# Patient Record
Sex: Female | Born: 1995 | Race: Asian | Hispanic: No | Marital: Married | Smoking: Never smoker
Health system: Southern US, Community
[De-identification: ages and names within clinical notes are randomized; demographics above are authoritative.]

## PROBLEM LIST (undated history)

## (undated) ENCOUNTER — Inpatient Hospital Stay (HOSPITAL_COMMUNITY): Payer: Self-pay

## (undated) DIAGNOSIS — O1490 Unspecified pre-eclampsia, unspecified trimester: Secondary | ICD-10-CM

## (undated) HISTORY — PX: NO PAST SURGERIES: SHX2092

---

## 2013-09-13 ENCOUNTER — Ambulatory Visit: Payer: Self-pay | Admitting: Family Medicine

## 2013-09-20 ENCOUNTER — Encounter: Payer: Self-pay | Admitting: Family Medicine

## 2013-09-20 ENCOUNTER — Ambulatory Visit: Payer: Self-pay | Attending: Family Medicine | Admitting: Family Medicine

## 2013-09-20 VITALS — BP 119/79 | HR 89 | Temp 98.0°F | Resp 16 | Ht 61.0 in | Wt 119.0 lb

## 2013-09-20 DIAGNOSIS — K5900 Constipation, unspecified: Secondary | ICD-10-CM | POA: Insufficient documentation

## 2013-09-20 DIAGNOSIS — R102 Pelvic and perineal pain unspecified side: Secondary | ICD-10-CM | POA: Insufficient documentation

## 2013-09-20 DIAGNOSIS — M545 Low back pain, unspecified: Secondary | ICD-10-CM | POA: Insufficient documentation

## 2013-09-20 DIAGNOSIS — Z0000 Encounter for general adult medical examination without abnormal findings: Secondary | ICD-10-CM

## 2013-09-20 DIAGNOSIS — N949 Unspecified condition associated with female genital organs and menstrual cycle: Secondary | ICD-10-CM | POA: Insufficient documentation

## 2013-09-20 DIAGNOSIS — K5901 Slow transit constipation: Secondary | ICD-10-CM

## 2013-09-20 LAB — POCT URINALYSIS DIPSTICK
BILIRUBIN UA: NEGATIVE
Glucose, UA: NEGATIVE
Ketones, UA: NEGATIVE
LEUKOCYTES UA: NEGATIVE
Nitrite, UA: NEGATIVE
Protein, UA: NEGATIVE
Spec Grav, UA: 1.025
UROBILINOGEN UA: 0.2
pH, UA: 5.5

## 2013-09-20 LAB — POCT URINE PREGNANCY: PREG TEST UR: NEGATIVE

## 2013-09-20 MED ORDER — POLYETHYLENE GLYCOL 3350 17 GM/SCOOP PO POWD: 17.0000 g | Freq: Every day | ORAL | Status: DC

## 2013-09-20 NOTE — Progress Notes (Signed)
   Subjective:    Patient ID: Krystal Wood, female    DOB: Jul 31, 1995, 18 y.o.   MRN: [id number] CC: establish care, pelvic pain  HPI 18 year old female with a past medical history presents to establish care discussed the following:  #1 pelvic pain: Patient reports pelvic pain for the past 4 months since she got married he essentially active. She presented pain is bilateral and constant. Pain is worse with intercourse. Pain associated with midline low back pain. Is also associated dysuria. There is no associated vaginal discharge. There is no vaginal odor. There is no itching or irritation. Patient and her husband do not use condoms. Patient reports that she does not enjoy sex because of the discomfort. Patient with her husband very nice manner for cystoscopy had sex she is not willing.   Patient does endorse constipation. She has a bowel movement once weekly. She did have out of is not painful.  #2 low back pain: Patient is a history of low back pain usually with menses since menarche age 19. She now has chronic low back pain since being married. Pain does not radiate. She's had no fever chills. There is no fecal or urinary incontinence. There's been no injury.  Social history: Patient moved from [place] 3 years ago. She lives with her husband's family.   Review of Systems As per HPI     Objective:   Physical Exam BP 119/79  Pulse 89  Temp(Src) 98 F (36.7 C) (Oral)  Resp 16  Ht  (1.549 m)  Wt 119 lb (53.978 kg)  BMI 22.50 kg/m2  SpO2 99%  LMP 08/23/2013 General appearance: alert, cooperative and no distress Head: Normocephalic, without obvious abnormality, atraumatic Eyes: conjunctivae/corneas clear. PERRL, EOM's intact. Fundi benign. Ears: normal TM's and external ear canals both ears Nose: Nares normal. Septum midline. Mucosa normal. No drainage or sinus tenderness. Throat: lips, mucosa, and tongue normal; teeth and gums normal Neck: no adenopathy, supple, symmetrical,  trachea midline and thyroid not enlarged, symmetric, no tenderness/mass/nodules Lungs: clear to auscultation bilaterally Heart: regular rate and rhythm, S1, S2 normal, no murmur, click, rub or gallop Abdomen: soft, non-tender; bowel sounds normal; no masses,  no organomegaly Back: midline lumbar pain around L1/L2, non tender  Pelvic: normal external genitalia, normal vagina, Scant mucoid vaginal discharge, cervix is normal without lesions, no CMT, uterus is normal without mass. Slight uterine and adnexal tenderness. Extremities: extremities normal, atraumatic, no cyanosis or edema Pulses: 2+ and symmetric Skin: Skin color, texture, turgor normal. No rashes or lesions      Assessment & Plan:

## 2013-09-20 NOTE — Progress Notes (Signed)
Pt is here to establish care. Pt reports having abdomen pain and lower back for 3 months.

## 2013-09-20 NOTE — Patient Instructions (Signed)
Krystal Wood,  Thank you for coming in today. I look forward to being your primary doctor.  I will be in touch with lab results.  Exam normal. For pain: Start miralax to regulate bowel movements. Goal is one soft stool every 1-2 days. Drink plenty of water and eat fiber.  Continue ibuprofen with foods as needed.  Come back if pain does not improve with regulating bowel movements or pain worsens.   Dr. Armen Pickup

## 2013-09-20 NOTE — Assessment & Plan Note (Signed)
A: pelvic pain. Normal exam. History consistent with constipation.  UA neg U preg neg P: Reassurance NSAID prn

## 2013-09-20 NOTE — Assessment & Plan Note (Signed)
A:  Constipation with bowel movements once weekly. I like her abdominal pain is related to this. No signs or symptoms of  Acute inflammatory process. P: Treat with MiraLax.  Advised increased intake of water and fiber.  Followup as needed.

## 2013-09-21 LAB — CYTOLOGY - PAP

## 2013-10-25 ENCOUNTER — Telehealth: Payer: Self-pay | Admitting: *Deleted

## 2013-10-25 NOTE — Telephone Encounter (Signed)
Pt is aware of her results.  

## 2013-12-17 ENCOUNTER — Encounter: Payer: Self-pay | Admitting: Family Medicine

## 2013-12-17 ENCOUNTER — Ambulatory Visit: Payer: Self-pay | Attending: Family Medicine | Admitting: Family Medicine

## 2013-12-17 VITALS — BP 107/75 | HR 71 | Temp 98.2°F | Resp 16 | Ht 61.0 in | Wt 122.0 lb

## 2013-12-17 DIAGNOSIS — Z331 Pregnant state, incidental: Secondary | ICD-10-CM

## 2013-12-17 DIAGNOSIS — Z3490 Encounter for supervision of normal pregnancy, unspecified, unspecified trimester: Secondary | ICD-10-CM | POA: Insufficient documentation

## 2013-12-17 DIAGNOSIS — R102 Pelvic and perineal pain: Secondary | ICD-10-CM

## 2013-12-17 DIAGNOSIS — Z3201 Encounter for pregnancy test, result positive: Secondary | ICD-10-CM | POA: Insufficient documentation

## 2013-12-17 LAB — POCT URINE PREGNANCY: PREG TEST UR: POSITIVE

## 2013-12-17 MED ORDER — PRENATAL 27-0.8 MG PO TABS: 1.0000 | ORAL_TABLET | Freq: Every day | ORAL | Status: DC

## 2013-12-17 NOTE — Assessment & Plan Note (Addendum)
A: early pregnancy, 3 w 1 d by LMP P: Prenatal vitamin Information provided Reviewed diet, medications to avoid, what to do in case of vaginal bleeding or severe pain  Pregnancy verification letter provided # to adopt-a-mom provided Patient instructed to find an OB and scheduled first prenatal visit by week 12 of pregnancy

## 2013-12-17 NOTE — Progress Notes (Signed)
Pt comes in to f/u for repeat pregnancy test s/p late menstrual LMP- 102/9/15 C/o nausea x 2 weeks with 2 episodes of vomiting x2 dys Breast tenderness also Preg test obtained

## 2013-12-17 NOTE — Progress Notes (Signed)
   Subjective:    Patient ID: Krystal Wood, female    DOB: [birth year], 18 y.o.   MRN: [id number] CC: ? Pregnancy  HPI 18 yo F presents for pregnancy test. LMP 11/25/13. Sexually active with husband. Trying to conceive. She has nausea for past week. Emesis x one yesterday. Fatigue. No fever, diarrhea, sick contacts. She had mild lower abdominal discomfort. No vaginal bleeding or discharge. No previous pregnancies.   Soc hx: non smoker, non ETOH, non drug use  Review of Systems As per HPI    Objective:   Physical Exam BP 107/75 mmHg  Pulse 71  Temp(Src) 98.2 F (36.8 C) (Oral)  Resp 16  Ht 5\' 1"  (1.549 m)  Wt 122 lb (55.339 kg)  BMI 23.06 kg/m2  SpO2 99%  LMP 11/25/2013 General appearance: alert, cooperative and no distress Abdomen: soft, non-tender; bowel sounds normal; no masses,  no organomegaly Skin: Skin color, texture, turgor normal. No rashes or lesions   U preg: POSITIVE        Assessment & Plan:

## 2013-12-17 NOTE — Patient Instructions (Addendum)
Ms. Krystal Wood,  Congratulations you are pregnant! Based on your last period of 11/25/13, you are 3 weeks and 1 day pregnant with a due date of 09/01/2014.   [facility] for [facility].  If you do not qualify for pregnancy medicaid, please call adopt a mom.  Adopt-A-Mom Program should contact Clance BollValeria Santolim at [phone] for an appointment.   About the Adopt-A-Mom Program: The Adopt-A-Mom Program is sponsored by the [employer] and coordinates prenatal care for low to medium-risk pregnant women who are not eligible for Medicaid, do not have private insurance to cover the cost of care, and cannot afford to pay out of pocket for care. Patients who wish to enroll in the Adopt-A-Mom Program should contact Clance BollValeria Santolim at [phone] for an appointment. The following is a current list of all participating OB[place]N practices in [place] who help ensure access to prenatal care for all women by providing prenatal care to Adopt-A-Mom Program participants: Dr. Francoise [facility]ard [facility] Dr. Arther [facility] The Adopt-A-Mom Program has also negotiated discounted rates for labwork and ultrasounds with our partners at [facility], [facility], and [facility].   You should aim to have your first prenatal visit by week  12 of pregnancy, in 9 more weeks.  Start prenatal vitamin.  If your hemoglobin is low you will need to start iron.  Dr. Armen PickupFunches   First Trimester of Pregnancy The first trimester of pregnancy is from week 1 until the end of week 12 (months 1 through 3). During this time, your baby will begin to develop inside you. At 6-8 weeks, the eyes and face are formed, and the heartbeat can be seen on ultrasound. At the end of 12 weeks, all the baby's organs are formed. Prenatal care is all the  medical care you receive before the birth of your baby. Make sure you get good prenatal care and follow all of your doctor's instructions. HOME CARE  Medicines  Take medicine only as told by your doctor. Some medicines are safe and some are not during pregnancy.  Take your prenatal vitamins as told by your doctor.  Take medicine that helps you poop (stool softener) as needed if your doctor says it is okay. Diet  Eat regular, healthy meals.  Your doctor will tell you the amount of weight gain that is right for you.  Avoid raw meat and uncooked cheese.  If you feel sick to your stomach (nauseous) or throw up (vomit):  Eat 4 or 5 small meals a day instead of 3 large meals.  Try eating a few soda crackers.  Drink liquids between meals instead of during meals.  If you have a hard time pooping (constipation):  Eat high-fiber foods like fresh vegetables, fruit, and whole grains.  Drink enough fluids to keep your pee (urine) clear or pale yellow. Activity and Exercise  Exercise only as told by your doctor. Stop exercising if you have cramps or pain in your lower belly (abdomen) or low back.  Try to avoid standing for long periods of time. Move your legs often if you must stand in one place for a long time.  Avoid heavy lifting.  Wear low-heeled shoes. Sit and stand up straight.  You can have sex unless your doctor tells you not to. Relief of Pain or Discomfort  Wear a good support bra if  your breasts are sore.  Take warm water baths (sitz baths) to soothe pain or discomfort caused by hemorrhoids. Use hemorrhoid cream if your doctor says it is okay.  Rest with your legs raised if you have leg cramps or low back pain.  Wear support hose if you have puffy, bulging veins (varicose veins) in your legs. Raise (elevate) your feet for 15 minutes, 3-4 times a day. Limit salt in your diet. Prenatal Care  Schedule your prenatal visits by the twelfth week of pregnancy.  Write down  your questions. Take them to your prenatal visits.  Keep all your prenatal visits as told by your doctor. Safety  Wear your seat belt at all times when driving.  Make a list of emergency phone numbers. The list should include numbers for family, friends, the hospital, and police and fire departments. General Tips  Ask your doctor for a referral to a local prenatal class. Begin classes no later than at the start of month 6 of your pregnancy.  Ask for help if you need counseling or help with nutrition. Your doctor can give you advice or tell you where to go for help.  Do not use hot tubs, steam rooms, or saunas.  Do not douche or use tampons or scented sanitary pads.  Do not cross your legs for long periods of time.  Avoid litter boxes and soil used by cats.  Avoid all smoking, herbs, and alcohol. Avoid drugs not approved by your doctor.  Visit your dentist. At home, brush your teeth with a soft toothbrush. Be gentle when you floss. GET HELP IF:  You are dizzy.  You have mild cramps or pressure in your lower belly.  You have a nagging pain in your belly area.  You continue to feel sick to your stomach, throw up, or have watery poop (diarrhea).  You have a bad smelling fluid coming from your vagina.  You have pain with peeing (urination).  You have increased puffiness (swelling) in your face, hands, legs, or ankles. GET HELP RIGHT AWAY IF:   You have a fever.  You are leaking fluid from your vagina.  You have spotting or bleeding from your vagina.  You have very bad belly cramping or pain.  You gain or lose weight rapidly.  You throw up blood. It may look like coffee grounds.  You are around people who have [place] measles, fifth disease, or chickenpox.  You have a very bad headache.  You have shortness of breath.  You have any kind of trauma, such as from a fall or a car accident. Document Released: 07/03/2007 Document Revised: 05/31/2013 Document Reviewed:  11/24/2012 [facility] Patient Information 2015 [place]. This information is not intended to replace advice given to you by your health care provider. Make sure you discuss any questions you have with your health care provider.

## 2013-12-18 LAB — CBC
HCT: 33.4 % — ABNORMAL LOW (ref 36.0–46.0)
Hemoglobin: 11.6 g/dL — ABNORMAL LOW (ref 12.0–15.0)
MCH: 30 pg (ref 26.0–34.0)
MCHC: 34.7 g/dL (ref 30.0–36.0)
MCV: 86.3 fL (ref 78.0–100.0)
MPV: 11.6 fL (ref 9.4–12.4)
PLATELETS: 203 10*3/uL (ref 150–400)
RBC: 3.87 MIL/uL (ref 3.87–5.11)
RDW: 13.2 % (ref 11.5–15.5)
WBC: 6 10*3/uL (ref 4.0–10.5)

## 2013-12-28 ENCOUNTER — Telehealth: Payer: Self-pay | Admitting: *Deleted

## 2013-12-28 NOTE — Telephone Encounter (Signed)
Left message with normal lab, if any question return call

## 2013-12-28 NOTE — Telephone Encounter (Signed)
-----   Message from Lora PaulaJosalyn C Funches, MD sent at 12/20/2013 10:37 AM EST ----- Normal CBC No need for extra iron

## 2014-01-03 ENCOUNTER — Other Ambulatory Visit: Payer: Self-pay | Admitting: Family Medicine

## 2014-01-03 DIAGNOSIS — O219 Vomiting of pregnancy, unspecified: Secondary | ICD-10-CM | POA: Insufficient documentation

## 2014-01-03 MED ORDER — VITAMIN B-6 25 MG PO TABS: 25.0000 mg | ORAL_TABLET | Freq: Four times a day (QID) | ORAL | Status: DC | PRN

## 2014-01-03 MED ORDER — DOXYLAMINE SUCCINATE (SLEEP) 25 MG PO TABS: 25.0000 mg | ORAL_TABLET | Freq: Three times a day (TID) | ORAL | Status: DC | PRN

## 2014-01-28 NOTE — L&D Delivery Note (Cosign Needed)
Delivery Note At 8:37 AM a viable female was delivered via Vaginal, Spontaneous Delivery (Presentation: vertex; Occiput Anterior).  APGAR: 9, 10; weight pending .   Placenta status: spontaneous, intact.  Cord: 3 vessels with the following complications: None.    Anesthesia: Epidural  Episiotomy: None Lacerations: 2nd degree;Perineal Suture Repair: vicryl Est. Blood Loss (mL):  pending  Mom to postpartum.  Baby to Couplet care / Skin to Skin.  Erasmo DownerAngela M Bacigalupo, MD, MPH PGY-2,  [facility] 07/30/2014 8:50 AM

## 2014-02-25 ENCOUNTER — Other Ambulatory Visit: Payer: Self-pay

## 2014-02-25 ENCOUNTER — Encounter: Payer: Self-pay | Admitting: Family Medicine

## 2014-03-04 ENCOUNTER — Other Ambulatory Visit (INDEPENDENT_AMBULATORY_CARE_PROVIDER_SITE_OTHER): Payer: Self-pay

## 2014-03-04 ENCOUNTER — Encounter: Payer: Self-pay | Admitting: Family Medicine

## 2014-03-04 ENCOUNTER — Other Ambulatory Visit: Payer: Self-pay

## 2014-03-04 DIAGNOSIS — Z3401 Encounter for supervision of normal first pregnancy, first trimester: Secondary | ICD-10-CM

## 2014-03-04 LAB — HIV ANTIBODY (ROUTINE TESTING W REFLEX): HIV 1&2 Ab, 4th Generation: NONREACTIVE

## 2014-03-04 NOTE — Progress Notes (Signed)
NEW OB LABS DONE TODAY Krystal Wood 

## 2014-03-05 LAB — SICKLE CELL SCREEN: Sickle Cell Screen: NEGATIVE

## 2014-03-07 LAB — OBSTETRIC PANEL
Antibody Screen: NEGATIVE
BASOS PCT: 0 % (ref 0–1)
Basophils Absolute: 0 10*3/uL (ref 0.0–0.1)
EOS PCT: 1 % (ref 0–5)
Eosinophils Absolute: 0.1 10*3/uL (ref 0.0–0.7)
HCT: 32.7 % — ABNORMAL LOW (ref 36.0–46.0)
Hemoglobin: 11.3 g/dL — ABNORMAL LOW (ref 12.0–15.0)
Hepatitis B Surface Ag: NEGATIVE
Lymphocytes Relative: 19 % (ref 12–46)
Lymphs Abs: 1.6 10*3/uL (ref 0.7–4.0)
MCH: 31 pg (ref 26.0–34.0)
MCHC: 34.6 g/dL (ref 30.0–36.0)
MCV: 89.8 fL (ref 78.0–100.0)
MONO ABS: 0.5 10*3/uL (ref 0.1–1.0)
MPV: 11.2 fL (ref 8.6–12.4)
Monocytes Relative: 6 % (ref 3–12)
Neutro Abs: 6.4 10*3/uL (ref 1.7–7.7)
Neutrophils Relative %: 74 % (ref 43–77)
PLATELETS: 214 10*3/uL (ref 150–400)
RBC: 3.64 MIL/uL — AB (ref 3.87–5.11)
RDW: 13.9 % (ref 11.5–15.5)
Rh Type: POSITIVE
Rubella: 4.07 Index — ABNORMAL HIGH (ref <0.90)
WBC: 8.6 10*3/uL (ref 4.0–10.5)

## 2014-03-22 ENCOUNTER — Ambulatory Visit (INDEPENDENT_AMBULATORY_CARE_PROVIDER_SITE_OTHER): Payer: Self-pay | Admitting: Family Medicine

## 2014-03-22 ENCOUNTER — Encounter: Payer: Self-pay | Admitting: Family Medicine

## 2014-03-22 VITALS — BP 107/71 | HR 91 | Temp 97.5°F | Wt 122.8 lb

## 2014-03-22 DIAGNOSIS — Z331 Pregnant state, incidental: Secondary | ICD-10-CM

## 2014-03-22 DIAGNOSIS — Z3490 Encounter for supervision of normal pregnancy, unspecified, unspecified trimester: Secondary | ICD-10-CM

## 2014-03-22 MED ORDER — PRENATAL 27-0.8 MG PO TABS
1.0000 | ORAL_TABLET | Freq: Every day | ORAL | Status: DC
Start: 2014-03-22 — End: 2014-07-29

## 2014-03-22 NOTE — Progress Notes (Signed)
Krystal CarawayZainab Wood is a 19 y.o. yo G1P0 at 3449w4d by LMP who presents for her initial prenatal visit. Pregnancy  isplanned [facility]. She  is notTaking PNV. See flow sheet for details.  PMH, POBH, FH, meds, allergies and Social Hx reviewed.  Prenatal exam:Gen: Well nourished, well developed.  No distress.  Vitals noted. HEENT: Normocephalic, atraumatic.  Neck supple without cervical lymphadenopathy, thyromegaly or thyroid nodules.  fair dentition. CV: RRR no murmur, gallops or rubs Lungs: CTAB.  Normal respiratory effort without wheezes or rales. Abd: soft, NTND. +BS.  Uterus not appreciated above pelvis. GU: Deferred. Recent Pap smear and vaginal exam Ext: No clubbing, cyanosis or edema. Psych: Normal grooming and dress.  Not depressed or anxious appearing.  Normal thought content and process without flight of ideas or looseness of associations    Assessment/plan: 1) Pregnancy doing well.  Current pregnancy issues include not taking PNV Dating is reliable, but measuring a little larger than dates Prenatal labs reviewed, notable for wnl. Bleeding and pain precautions reviewed. Importance of prenatal vitamins reviewed.  Genetic screening offered.  Early glucola is not indicated.    Follow up 4 weeks.

## 2014-03-22 NOTE — Progress Notes (Deleted)
  Subjective:    Krystal CarawayZainab Schwager is being seen today for her first obstetrical visit.  This {is/is not:9024} a planned pregnancy. She is at Unknown gestation. Her obstetrical history is significant for {ob risk factors:10154}. Relationship with FOB: {fob:16621}. Patient {does/does not:19097} intend to breast feed. Pregnancy history fully reviewed.  Patient reports {sx:14538}.  Review of Systems:   Review of Systems  Objective:     BP 107/71 mmHg  Pulse 91  Temp(Src) 97.5 F (36.4 C)  Wt 122 lb 12.8 oz (55.702 kg)  LMP 11/25/2013 Physical Exam  Exam    Assessment:    Pregnancy: G1P0 Patient Active Problem List   Diagnosis Date Noted  . Nausea and vomiting in pregnancy prior to [redacted] weeks gestation 01/03/2014  . Pregnant 12/17/2013  . Pelvic pain in female 09/20/2013  . Constipation 09/20/2013       Plan:     Initial labs drawn. Prenatal vitamins. Problem list reviewed and updated. AFP3 discussed: {requests/ordered/declines:14581}. Role of ultrasound in pregnancy discussed; fetal survey: {requests/ordered/declines:14581}. Amniocentesis discussed: {amniocentesis:14582}. Follow up in {numbers 0-4:31231} weeks. ***% of *** min visit spent on counseling and coordination of care.  ***   Shirlee LatchBacigalupo, Tinia Oravec 03/22/2014

## 2014-03-22 NOTE — Patient Instructions (Signed)
Second Trimester of Pregnancy The second trimester is from week 13 through week 28, months 4 through 6. The second trimester is often a time when you feel your best. Your body has also adjusted to being pregnant, and you begin to feel better physically. Usually, morning sickness has lessened or quit completely, you may have more energy, and you may have an increase in appetite. The second trimester is also a time when the fetus is growing rapidly. At the end of the sixth month, the fetus is about 9 inches long and weighs about 1 pounds. You will likely begin to feel the baby move (quickening) between 18 and 20 weeks of the pregnancy. BODY CHANGES Your body goes through many changes during pregnancy. The changes vary from woman to woman.   Your weight will continue to increase. You will notice your lower abdomen bulging out.  You may begin to get stretch marks on your hips, abdomen, and breasts.  You may develop headaches that can be relieved by medicines approved by your health care provider.  You may urinate more often because the fetus is pressing on your bladder.  You may develop or continue to have heartburn as a result of your pregnancy.  You may develop constipation because certain hormones are causing the muscles that push waste through your intestines to slow down.  You may develop hemorrhoids or swollen, bulging veins (varicose veins).  You may have back pain because of the weight gain and pregnancy hormones relaxing your joints between the bones in your pelvis and as a result of a shift in weight and the muscles that support your balance.  Your breasts will continue to grow and be tender.  Your gums may bleed and may be sensitive to brushing and flossing.  Dark spots or blotches (chloasma, mask of pregnancy) may develop on your face. This will likely fade after the baby is born.  A dark line from your belly button to the pubic area (linea nigra) may appear. This will likely fade  after the baby is born.  You may have changes in your hair. These can include thickening of your hair, rapid growth, and changes in texture. Some women also have hair loss during or after pregnancy, or hair that feels dry or thin. Your hair will most likely return to normal after your baby is born. WHAT TO EXPECT AT YOUR PRENATAL VISITS During a routine prenatal visit:  You will be weighed to make sure you and the fetus are growing normally.  Your blood pressure will be taken.  Your abdomen will be measured to track your baby's growth.  The fetal heartbeat will be listened to.  Any test results from the previous visit will be discussed. Your health care provider may ask you:  How you are feeling.  If you are feeling the baby move.  If you have had any abnormal symptoms, such as leaking fluid, bleeding, severe headaches, or abdominal cramping.  If you have any questions. Other tests that may be performed during your second trimester include:  Blood tests that check for:  Low iron levels (anemia).  Gestational diabetes (between 24 and 28 weeks).  Rh antibodies.  Urine tests to check for infections, diabetes, or protein in the urine.  An ultrasound to confirm the proper growth and development of the baby.  An amniocentesis to check for possible genetic problems.  Fetal screens for spina bifida and Down syndrome. HOME CARE INSTRUCTIONS   Avoid all smoking, herbs, alcohol, and unprescribed   drugs. These chemicals affect the formation and growth of the baby.  Follow your health care provider's instructions regarding medicine use. There are medicines that are either safe or unsafe to take during pregnancy.  Exercise only as directed by your health care provider. Experiencing uterine cramps is a good sign to stop exercising.  Continue to eat regular, healthy meals.  Wear a good support bra for breast tenderness.  Do not use hot tubs, steam rooms, or saunas.  Wear your  seat belt at all times when driving.  Avoid raw meat, uncooked cheese, cat litter boxes, and soil used by cats. These carry germs that can cause birth defects in the baby.  Take your prenatal vitamins.  Try taking a stool softener (if your health care provider approves) if you develop constipation. Eat more high-fiber foods, such as fresh vegetables or fruit and whole grains. Drink plenty of fluids to keep your urine clear or pale yellow.  Take warm sitz baths to soothe any pain or discomfort caused by hemorrhoids. Use hemorrhoid cream if your health care provider approves.  If you develop varicose veins, wear support hose. Elevate your feet for 15 minutes, 3-4 times a day. Limit salt in your diet.  Avoid heavy lifting, wear low heel shoes, and practice good posture.  Rest with your legs elevated if you have leg cramps or low back pain.  Visit your dentist if you have not gone yet during your pregnancy. Use a soft toothbrush to brush your teeth and be gentle when you floss.  A sexual relationship may be continued unless your health care provider directs you otherwise.  Continue to go to all your prenatal visits as directed by your health care provider. SEEK MEDICAL CARE IF:   You have dizziness.  You have mild pelvic cramps, pelvic pressure, or nagging pain in the abdominal area.  You have persistent nausea, vomiting, or diarrhea.  You have a bad smelling vaginal discharge.  You have pain with urination. SEEK IMMEDIATE MEDICAL CARE IF:   You have a fever.  You are leaking fluid from your vagina.  You have spotting or bleeding from your vagina.  You have severe abdominal cramping or pain.  You have rapid weight gain or loss.  You have shortness of breath with chest pain.  You notice sudden or extreme swelling of your face, hands, ankles, feet, or legs.  You have not felt your baby move in over an hour.  You have severe headaches that do not go away with  medicine.  You have vision changes. Document Released: 01/08/2001 Document Revised: 01/19/2013 Document Reviewed: 03/17/2012 ExitCare Patient Information 2015 [employer]. This information is not intended to replace advice given to you by your health care provider. Make sure you discuss any questions you have with your health care provider.  

## 2014-04-11 ENCOUNTER — Ambulatory Visit (HOSPITAL_COMMUNITY)
Admission: RE | Admit: 2014-04-11 | Discharge: 2014-04-11 | Disposition: A | Discharge status: Home / Self Care | Payer: Self-pay | Source: Ambulatory Visit | Attending: Family Medicine | Admitting: Family Medicine

## 2014-04-11 DIAGNOSIS — Z3689 Encounter for other specified antenatal screening: Secondary | ICD-10-CM | POA: Insufficient documentation

## 2014-04-11 DIAGNOSIS — Z3490 Encounter for supervision of normal pregnancy, unspecified, unspecified trimester: Secondary | ICD-10-CM

## 2014-04-11 DIAGNOSIS — Z3A24 24 weeks gestation of pregnancy: Secondary | ICD-10-CM | POA: Insufficient documentation

## 2014-04-11 DIAGNOSIS — Z36 Encounter for antenatal screening of mother: Secondary | ICD-10-CM | POA: Insufficient documentation

## 2014-04-14 ENCOUNTER — Encounter: Payer: Self-pay | Admitting: Family Medicine

## 2014-04-14 ENCOUNTER — Ambulatory Visit (INDEPENDENT_AMBULATORY_CARE_PROVIDER_SITE_OTHER): Payer: Self-pay | Admitting: Family Medicine

## 2014-04-14 ENCOUNTER — Other Ambulatory Visit (HOSPITAL_COMMUNITY)
Admission: RE | Admit: 2014-04-14 | Discharge: 2014-04-14 | Disposition: A | Discharge status: Home / Self Care | Payer: Self-pay | Source: Ambulatory Visit | Attending: Family Medicine | Admitting: Family Medicine

## 2014-04-14 VITALS — BP 117/67 | HR 101 | Temp 97.7°F | Wt 128.0 lb

## 2014-04-14 DIAGNOSIS — R42 Dizziness and giddiness: Secondary | ICD-10-CM

## 2014-04-14 DIAGNOSIS — Z3492 Encounter for supervision of normal pregnancy, unspecified, second trimester: Secondary | ICD-10-CM

## 2014-04-14 DIAGNOSIS — Z01419 Encounter for gynecological examination (general) (routine) without abnormal findings: Secondary | ICD-10-CM | POA: Insufficient documentation

## 2014-04-14 LAB — POCT HEMOGLOBIN: Hemoglobin: 10.3 g/dL — AB (ref 12.2–16.2)

## 2014-04-14 NOTE — Patient Instructions (Signed)
It was nice seeing you today. Everything to be going on well with your pregnancy. Please try to eat 3 times daily with snack in between. This will help with your dizziness. We will check your blood level today to ensure all is well. If dizziness becomes persistent [facility]. We will see you back in 4 wks.

## 2014-04-14 NOTE — Progress Notes (Signed)
19 Y/O G1P0 present for routine prenatal care, denies abdominal pain, no vaginal discharge or bleeding, no leg swelling. She c/o occasional dizziness, she denies fainting or LOC. She does not eat well, less than 3 meal per day. She uses her prenatal vitamin.   Exam: Check flow sheet. CV/Resp: Lungs CTA B/L. S1 S2 normal, RRR,no murmurs. Abd: Gravid, no tenderness. Ext: No edema  A/P: 19 Y/O G1P0 at 20 wk by LMP which she is uncertain or [redacted] wks GA by U/S done 3 days ago.        Since she is uncertain of her LMP we will go by the U/S.        Continue prenatal vitamin.        Hgb dropped to 10 today from 11.         May add iron to PNV.        I encourage good diet with 3 meals per day and snack in between for health baby and mother.        Advised to go to [facility] if dizziness persist or worsens.        Flu shot offered but she declined it.        PAP done today.        F/U in 4 wks.

## 2014-04-18 LAB — CYTOLOGY - PAP

## 2014-04-19 ENCOUNTER — Telehealth: Payer: Self-pay | Admitting: Family Medicine

## 2014-04-19 NOTE — Telephone Encounter (Signed)
PAP result discussed with patient,all questions were answered.

## 2014-05-19 ENCOUNTER — Ambulatory Visit (INDEPENDENT_AMBULATORY_CARE_PROVIDER_SITE_OTHER): Payer: Self-pay | Admitting: Family Medicine

## 2014-05-19 ENCOUNTER — Other Ambulatory Visit (HOSPITAL_COMMUNITY): Admission: RE | Admit: 2014-05-19 | Payer: Self-pay | Source: Ambulatory Visit

## 2014-05-19 VITALS — BP 115/77 | HR 109 | Temp 98.2°F | Wt 139.8 lb

## 2014-05-19 DIAGNOSIS — Z3403 Encounter for supervision of normal first pregnancy, third trimester: Secondary | ICD-10-CM

## 2014-05-19 DIAGNOSIS — Z23 Encounter for immunization: Secondary | ICD-10-CM

## 2014-05-19 LAB — POCT WET PREP (WET MOUNT): CLUE CELLS WET PREP WHIFF POC: NEGATIVE

## 2014-05-19 LAB — CBC
HCT: 32.9 % — ABNORMAL LOW (ref 36.0–46.0)
Hemoglobin: 11.1 g/dL — ABNORMAL LOW (ref 12.0–15.0)
MCH: 30.9 pg (ref 26.0–34.0)
MCHC: 33.7 g/dL (ref 30.0–36.0)
MCV: 91.6 fL (ref 78.0–100.0)
MPV: 11.3 fL (ref 8.6–12.4)
Platelets: 186 10*3/uL (ref 150–400)
RBC: 3.59 MIL/uL — ABNORMAL LOW (ref 3.87–5.11)
RDW: 13.1 % (ref 11.5–15.5)
WBC: 10.6 10*3/uL — AB (ref 4.0–10.5)

## 2014-05-19 LAB — GLUCOSE, CAPILLARY
Comment 1: 1
GLUCOSE-CAPILLARY: 124 mg/dL — AB (ref 70–99)

## 2014-05-19 NOTE — Patient Instructions (Signed)
If you have any cramping/contractions, vaginal bleeding, fluid leaking, or are worried that baby is not moving normally, go immediately to [facility] to be evaluated.  Follow up in 2 weeks  Be well, Dr. Pollie Meyer    Third Trimester of Pregnancy The third trimester is from week 29 through week 42, months 7 through 9. The third trimester is a time when the fetus is growing rapidly. At the end of the ninth month, the fetus is about 20 inches in length and weighs 6-10 pounds.  BODY CHANGES Your body goes through many changes during pregnancy. The changes vary from woman to woman.   Your weight will continue to increase. You can expect to gain 25-35 pounds (11-16 kg) by the end of the pregnancy.  You may begin to get stretch marks on your hips, abdomen, and breasts.  You may urinate more often because the fetus is moving lower into your pelvis and pressing on your bladder.  You may develop or continue to have heartburn as a result of your pregnancy.  You may develop constipation because certain hormones are causing the muscles that push waste through your intestines to slow down.  You may develop hemorrhoids or swollen, bulging veins (varicose veins).  You may have pelvic pain because of the weight gain and pregnancy hormones relaxing your joints between the bones in your pelvis. Backaches may result from overexertion of the muscles supporting your posture.  You may have changes in your hair. These can include thickening of your hair, rapid growth, and changes in texture. Some women also have hair loss during or after pregnancy, or hair that feels dry or thin. Your hair will most likely return to normal after your baby is born.  Your breasts will continue to grow and be tender. A yellow discharge may leak from your breasts called colostrum.  Your belly button may stick out.  You may feel short of breath because of your expanding uterus.  You may notice the fetus "dropping," or  moving lower in your abdomen.  You may have a bloody mucus discharge. This usually occurs a few days to a week before labor begins.  Your cervix becomes thin and soft (effaced) near your due date. WHAT TO EXPECT AT YOUR PRENATAL EXAMS  You will have prenatal exams every 2 weeks until week 36. Then, you will have weekly prenatal exams. During a routine prenatal visit:  You will be weighed to make sure you and the fetus are growing normally.  Your blood pressure is taken.  Your abdomen will be measured to track your baby's growth.  The fetal heartbeat will be listened to.  Any test results from the previous visit will be discussed.  You may have a cervical check near your due date to see if you have effaced. At around 36 weeks, your caregiver will check your cervix. At the same time, your caregiver will also perform a test on the secretions of the vaginal tissue. This test is to determine if a type of bacteria, Group B streptococcus, is present. Your caregiver will explain this further. Your caregiver may ask you:  What your birth plan is.  How you are feeling.  If you are feeling the baby move.  If you have had any abnormal symptoms, such as leaking fluid, bleeding, severe headaches, or abdominal cramping.  If you have any questions. Other tests or screenings that may be performed during your third trimester include:  Blood tests that check for low iron levels (anemia).  Fetal testing to check the health, activity level, and growth of the fetus. Testing is done if you have certain medical conditions or if there are problems during the pregnancy. FALSE LABOR You may feel small, irregular contractions that eventually go away. These are called Braxton Hicks contractions, or false labor. Contractions may last for hours, days, or even weeks before true labor sets in. If contractions come at regular intervals, intensify, or become painful, it is best to be seen by your caregiver.   SIGNS OF LABOR   Menstrual-like cramps.  Contractions that are 5 minutes apart or less.  Contractions that start on the top of the uterus and spread down to the lower abdomen and back.  A sense of increased pelvic pressure or back pain.  A watery or bloody mucus discharge that comes from the vagina. If you have any of these signs before the 37th week of pregnancy, call your caregiver right away. You need to go to the hospital to get checked immediately. HOME CARE INSTRUCTIONS   Avoid all smoking, herbs, alcohol, and unprescribed drugs. These chemicals affect the formation and growth of the baby.  Follow your caregiver's instructions regarding medicine use. There are medicines that are either safe or unsafe to take during pregnancy.  Exercise only as directed by your caregiver. Experiencing uterine cramps is a good sign to stop exercising.  Continue to eat regular, healthy meals.  Wear a good support bra for breast tenderness.  Do not use hot tubs, steam rooms, or saunas.  Wear your seat belt at all times when driving.  Avoid raw meat, uncooked cheese, cat litter boxes, and soil used by cats. These carry germs that can cause birth defects in the baby.  Take your prenatal vitamins.  Try taking a stool softener (if your caregiver approves) if you develop constipation. Eat more high-fiber foods, such as fresh vegetables or fruit and whole grains. Drink plenty of fluids to keep your urine clear or pale yellow.  Take warm sitz baths to soothe any pain or discomfort caused by hemorrhoids. Use hemorrhoid cream if your caregiver approves.  If you develop varicose veins, wear support hose. Elevate your feet for 15 minutes, 3-4 times a day. Limit salt in your diet.  Avoid heavy lifting, wear low heal shoes, and practice good posture.  Rest a lot with your legs elevated if you have leg cramps or low back pain.  Visit your dentist if you have not gone during your pregnancy. Use a soft  toothbrush to brush your teeth and be gentle when you floss.  A sexual relationship may be continued unless your caregiver directs you otherwise.  Do not travel far distances unless it is absolutely necessary and only with the approval of your caregiver.  Take prenatal classes to understand, practice, and ask questions about the labor and delivery.  Make a trial run to the hospital.  Pack your hospital bag.  Prepare the baby's nursery.  Continue to go to all your prenatal visits as directed by your caregiver. SEEK MEDICAL CARE IF:  You are unsure if you are in labor or if your water has broken.  You have dizziness.  You have mild pelvic cramps, pelvic pressure, or nagging pain in your abdominal area.  You have persistent nausea, vomiting, or diarrhea.  You have a bad smelling vaginal discharge.  You have pain with urination. SEEK IMMEDIATE MEDICAL CARE IF:   You have a fever.  You are leaking fluid from your vagina.  You  have spotting or bleeding from your vagina.  You have severe abdominal cramping or pain.  You have rapid weight loss or gain.  You have shortness of breath with chest pain.  You notice sudden or extreme swelling of your face, hands, ankles, feet, or legs.  You have not felt your baby move in over an hour.  You have severe headaches that do not go away with medicine.  You have vision changes. Document Released: 01/08/2001 Document Revised: 01/19/2013 Document Reviewed: 03/17/2012 [facility] Patient Information 2015 [place]Care, [place]. This information is not intended to replace advice given to you by your health care provider. Make sure you discuss any questions you have with your health care provider.

## 2014-05-19 NOTE — Progress Notes (Signed)
Krystal CarawayZainab Wood is a 19 y.o. G1P0 at 5735w0d for routine follow up.  She reports she's doing well. Hass occasional sharp pain in abdomen that resolves with sitting. No cramping/ctx. No LOF but has lots of white thick discharge. No vaginal bleeding. Good fetal movement.  See flow sheet for details.  A/P: Pregnancy at 3935w0d.  Doing well.   Tdap given today 1 hour GTT done today - normal Also draw CBC, RPR, HIV today. Wet prep & gc chlamydia done as these have not been done prior to now during this pregnancy. Wet prep neg - suggests leukorrhea of pregnancy. PHQ-9 score 4, 0 on #9 (SI), not difficult Pregnancy medical home form reviewed, no concerns Discuss feeding and contraception at next visit. RH status was reviewed and pt does not need Rhogam.  Rhogam was not given today.  Childbirth and education classes were offered but pt declined Preterm labor and fetal movement precautions reviewed. Follow up 2 weeks.

## 2014-05-20 ENCOUNTER — Encounter: Payer: Self-pay | Admitting: Family Medicine

## 2014-05-20 LAB — CERVICOVAGINAL ANCILLARY ONLY
Chlamydia: NEGATIVE
NEISSERIA GONORRHEA: NEGATIVE
Trichomonas: NEGATIVE

## 2014-05-20 LAB — HIV ANTIBODY (ROUTINE TESTING W REFLEX): HIV 1&2 Ab, 4th Generation: NONREACTIVE

## 2014-05-20 LAB — RPR

## 2014-06-02 ENCOUNTER — Ambulatory Visit (INDEPENDENT_AMBULATORY_CARE_PROVIDER_SITE_OTHER): Payer: Self-pay | Admitting: Family Medicine

## 2014-06-02 VITALS — BP 115/75 | HR 105 | Temp 97.7°F | Wt 147.0 lb

## 2014-06-02 DIAGNOSIS — Z3480 Encounter for supervision of other normal pregnancy, unspecified trimester: Secondary | ICD-10-CM | POA: Insufficient documentation

## 2014-06-02 DIAGNOSIS — Z3483 Encounter for supervision of other normal pregnancy, third trimester: Secondary | ICD-10-CM

## 2014-06-02 NOTE — Progress Notes (Signed)
Joneen CarawayZainab Oriol is a 19 y.o. G1P0 at 794w0d for routine follow up.  She reports occasional mild contractions. See flow sheet for details.  A/P: Pregnancy at 194w0d.  Doing well.   Pregnancy issues include none  Review feeding and contraception at next visit Infant circumcision desired not applicable  Tdapwas not given today. Received at 30 weeks GBS/GC/CZ testing was not performed today. Plan to do in 2 weeks  Preterm labor precautions reviewed. Safe sleep discussed. Kick counts reviewed. On review of previous ultrasound a follow-up was recommended in 6-8 weeks which is now so it was scheduled today. Follow up 2 weeks.

## 2014-06-02 NOTE — Patient Instructions (Signed)
Braxton Hicks Contractions °Contractions of the uterus can occur throughout pregnancy. Contractions are not always a sign that you are in labor.  °WHAT ARE BRAXTON HICKS CONTRACTIONS?  °Contractions that occur before labor are called Braxton Hicks contractions, or false labor. Toward the end of pregnancy (32-34 weeks), these contractions can develop more often and may become more forceful. This is not true labor because these contractions do not result in opening (dilatation) and thinning of the cervix. They are sometimes difficult to tell apart from true labor because these contractions can be forceful and people have different pain tolerances. You should not feel embarrassed if you go to the hospital with false labor. Sometimes, the only way to tell if you are in true labor is for your health care provider to look for changes in the cervix. °If there are no prenatal problems or other health problems associated with the pregnancy, it is completely safe to be sent home with false labor and await the onset of true labor. °HOW CAN YOU TELL THE DIFFERENCE BETWEEN TRUE AND FALSE LABOR? °False Labor °· The contractions of false labor are usually shorter and not as hard as those of true labor.   °· The contractions are usually irregular.   °· The contractions are often felt in the front of the lower abdomen and in the groin.   °· The contractions may go away when you walk around or change positions while lying down.   °· The contractions get weaker and are shorter lasting as time goes on.   °· The contractions do not usually become progressively stronger, regular, and closer together as with true labor.   °True Labor °· Contractions in true labor last 30-70 seconds, become very regular, usually become more intense, and increase in frequency.   °· The contractions do not go away with walking.   °· The discomfort is usually felt in the top of the uterus and spreads to the lower abdomen and low back.   °· True labor can be  determined by your health care provider with an exam. This will show that the cervix is dilating and getting thinner.   °WHAT TO REMEMBER °· Keep up with your usual exercises and follow other instructions given by your health care provider.   °· Take medicines as directed by your health care provider.   °· Keep your regular prenatal appointments.   °· Eat and drink lightly if you think you are going into labor.   °· If Braxton Hicks contractions are making you uncomfortable:   °¨ Change your position from lying down or resting to walking, or from walking to resting.   °¨ Sit and rest in a tub of warm water.   °¨ Drink 2-3 glasses of water. Dehydration may cause these contractions.   °¨ Do slow and deep breathing several times an hour.   °WHEN SHOULD I SEEK IMMEDIATE MEDICAL CARE? °Seek immediate medical care if: °· Your contractions become stronger, more regular, and closer together.   °· You have fluid leaking or gushing from your vagina.   °· You have a fever.   °· You pass blood-tinged mucus.   °· You have vaginal bleeding.   °· You have continuous abdominal pain.   °· You have low back pain that you never had before.   °· You feel your baby's head pushing down and causing pelvic pressure.   °· Your baby is not moving as much as it used to.   °Document Released: 01/14/2005 Document Revised: 01/19/2013 Document Reviewed: 10/26/2012 °ExitCare® Patient Information ©2015 [employer]. This information is not intended to replace advice given to you by your health care   provider. Make sure you discuss any questions you have with your health care provider. ° °

## 2014-06-02 NOTE — Assessment & Plan Note (Addendum)
  Clinic/provider [facility] Shirlee Latch(Angela Bacigalupo) Prenatal Labs  Dating 24w ultrasound (revised as was off LMP by >4 weeks) Blood type: O/POS/-- (02/05 1404)   Genetic Screen Too late to care Antibody:NEG (02/05 1404)  Anatomic US Normal female at 24 weeks Rubella: 4.07 (02/05 1404)  GTT Early:               Third trimester: 124 RPR: NON REAC (04/21 1059)   Flu vaccine  not flu season HBsAg: NEGATIVE (02/05 1404)   TDaP vaccine  05/19/14                                Rhogam: not indicated HIV: NONREACTIVE (04/21 1059)   GBS                                              (For PCN allergy, check sensitivities) GBS:   Contraception  Pap: normal on 04/14/14  Baby Food    Circumcision n/a   [facility]   Support Person FOB

## 2014-06-06 ENCOUNTER — Ambulatory Visit (HOSPITAL_COMMUNITY)
Admission: RE | Admit: 2014-06-06 | Discharge: 2014-06-06 | Disposition: A | Discharge status: Home / Self Care | Payer: Self-pay | Source: Ambulatory Visit | Attending: Family Medicine | Admitting: Family Medicine

## 2014-06-06 DIAGNOSIS — Z3483 Encounter for supervision of other normal pregnancy, third trimester: Secondary | ICD-10-CM | POA: Insufficient documentation

## 2014-06-07 ENCOUNTER — Telehealth: Payer: Self-pay | Admitting: *Deleted

## 2014-06-07 NOTE — Telephone Encounter (Signed)
Pt informed. Deseree Blount, CMA  

## 2014-06-07 NOTE — Telephone Encounter (Signed)
-----   Message from Abram SanderElena M Adamo, MD sent at 06/07/2014  1:12 PM EDT ----- Please let patient know that her ultrasound was normal, her baby is growing well.

## 2014-06-16 ENCOUNTER — Ambulatory Visit (INDEPENDENT_AMBULATORY_CARE_PROVIDER_SITE_OTHER): Payer: Self-pay | Admitting: Family Medicine

## 2014-06-16 VITALS — BP 118/69 | HR 89 | Temp 97.7°F | Wt 150.0 lb

## 2014-06-16 DIAGNOSIS — Z3483 Encounter for supervision of other normal pregnancy, third trimester: Secondary | ICD-10-CM

## 2014-06-16 NOTE — Progress Notes (Signed)
Krystal Wood is a 19 y.o. G1P0 at 7142w0d for routine follow up.  She reports good FM, no bleeding, LOF or ctx.  See flow sheet for details.  A/P: Pregnancy at 2242w0d.  Doing well.   Pregnancy issues include none  Infant feeding choice breast Contraception choice - undecided, likely POPs Infant circumcision desired not applicable  Tdapwas not given today. Received 05/19/14 GBS/GC/CZ testing was not performed today. Plan for next visit  Preterm labor precautions reviewed. Safe sleep discussed. Kick counts reviewed. Follow up 2 weeks.

## 2014-06-16 NOTE — Patient Instructions (Signed)
Fetal Movement Counts Performing a fetal movement count is highly recommended in high-risk pregnancies, but it is good for every pregnant woman to do. Your health care provider may ask you to start counting fetal movements at 28 weeks of the pregnancy. Fetal movements often increase:  After eating a full meal.  After physical activity.  After eating or drinking something sweet or cold.  At rest. Pay attention to when you feel the baby is most active. This will help you notice a pattern of your baby's sleep and wake cycles and what factors contribute to an increase in fetal movement. It is important to perform a fetal movement count at the same time each day when your baby is normally most active.  HOW TO COUNT FETAL MOVEMENTS 1. Find a quiet and comfortable area to sit or lie down on your left side. Lying on your left side provides the best blood and oxygen circulation to your baby. 2. Write down the day and time on a sheet of paper or in a journal. 3. Start counting kicks, flutters, swishes, rolls, or jabs in a 2-hour period. You should feel at least 10 movements within 2 hours. 4. If you do not feel 10 movements in 2 hours, wait 2-3 hours and count again. Look for a change in the pattern or not enough counts in 2 hours. SEEK MEDICAL CARE IF:  You feel less than 10 counts in 2 hours, tried twice.  There is no movement in over an hour.  The pattern is changing or taking longer each day to reach 10 counts in 2 hours.  You feel the baby is not moving as he or she usually does. Document Released: 02/13/2006 Document Revised: 05/31/2013 Document Reviewed: 11/11/2011 [facility] CenterExit[facility] Patient Information 2015 [place]Care, [place]LLC. This information is not intended to replace advice given to you by your health care provider. Make sure you discuss any questions you have with your health care provider.

## 2014-07-04 ENCOUNTER — Other Ambulatory Visit (HOSPITAL_COMMUNITY): Admission: RE | Admit: 2014-07-04 | Payer: Self-pay | Source: Ambulatory Visit | Admitting: Family Medicine

## 2014-07-04 ENCOUNTER — Ambulatory Visit (INDEPENDENT_AMBULATORY_CARE_PROVIDER_SITE_OTHER): Payer: Self-pay | Admitting: Family Medicine

## 2014-07-04 VITALS — BP 124/76 | HR 98 | Temp 97.5°F | Wt 157.7 lb

## 2014-07-04 DIAGNOSIS — Z3483 Encounter for supervision of other normal pregnancy, third trimester: Secondary | ICD-10-CM

## 2014-07-04 NOTE — Patient Instructions (Signed)
Nice to see you again today. Please see the information below about labor precautions. If you have any vaginal bleeding, loss of fluid, or regular contractions [facility].  Take care, Dr. B  Preterm Labor Information Preterm labor is when labor starts at less than 37 weeks of pregnancy. The normal length of a pregnancy is 39 to 41 weeks. CAUSES Often, there is no identifiable underlying cause as to why a woman goes into preterm labor. One of the most common known causes of preterm labor is infection. Infections of the uterus, cervix, vagina, amniotic sac, bladder, kidney, or even the lungs (pneumonia) can cause labor to start. Other suspected causes of preterm labor include:   Urogenital infections, such as yeast infections and bacterial vaginosis.   Uterine abnormalities (uterine shape, uterine septum, fibroids, or bleeding from the placenta).   A cervix that has been operated on (it may fail to stay closed).   Malformations in the fetus.   Multiple gestations (twins, triplets, and so on).   Breakage of the amniotic sac.  RISK FACTORS  Having a previous history of preterm labor.   Having premature rupture of membranes (PROM).   Having a placenta that covers the opening of the cervix (placenta previa).   Having a placenta that separates from the uterus (placental abruption).   Having a cervix that is too weak to hold the fetus in the uterus (incompetent cervix).   Having too much fluid in the amniotic sac (polyhydramnios).   Taking illegal drugs or smoking while pregnant.   Not gaining enough weight while pregnant.   Being younger than 7018 and older than 21 years old.   Having a low socioeconomic status.   Being African American. SYMPTOMS Signs and symptoms of preterm labor include:   Menstrual-like cramps, abdominal pain, or back pain.  Uterine contractions that are regular, as frequent as six in an  hour, regardless of their intensity (may be mild or painful).  Contractions that start on the top of the uterus and spread down to the lower abdomen and back.   A sense of increased pelvic pressure.   A watery or bloody mucus discharge that comes from the vagina.  TREATMENT Depending on the length of the pregnancy and other circumstances, your health care provider may suggest bed rest. If necessary, there are medicines that can be given to stop contractions and to mature the fetal lungs. If labor happens before 34 weeks of pregnancy, a prolonged hospital stay may be recommended. Treatment depends on the condition of both you and the fetus.  WHAT SHOULD YOU DO IF YOU THINK YOU ARE IN PRETERM LABOR? Call your health care provider right away. You will need to go to the hospital to get checked immediately. HOW CAN YOU PREVENT PRETERM LABOR IN FUTURE PREGNANCIES? You should:   Stop smoking if you smoke.  Maintain healthy weight gain and avoid chemicals and drugs that are not necessary.  Be watchful for any type of infection.  Inform your health care provider if you have a known history of preterm labor. Document Released: 04/06/2003 Document Revised: 09/16/2012 Document Reviewed: 02/17/2012 [facility] Cod[facility] Patient Information 2015 [place]ExitCare, [place]LLC. This information is not intended to replace advice given to you by your health care provider. Make sure you discuss any questions you have with your health care provider.

## 2014-07-04 NOTE — Progress Notes (Signed)
Krystal CarawayZainab Wood is a 19 y.o. G1P0 at 8010w4d for routine follow up.  She reports no LOF, VB, Ctx.  Reports stretching sensation around skin on abdomen.  See flow sheet for details.  A/P: Pregnancy at 3110w4d.  Doing well.   Pregnancy issues include - none  Infant feeding choice breast Contraception choice - undecided Infant circumcision desired not applicable  Tdapwas not given today. Received 05/19/14 GBS/GC/CZ testing was performed today.  Preterm labor precautions reviewed. Safe sleep discussed. Kick counts reviewed. Follow up 2 weeks in OB clinic.

## 2014-07-05 LAB — CERVICOVAGINAL ANCILLARY ONLY
Chlamydia: NEGATIVE
Neisseria Gonorrhea: NEGATIVE

## 2014-07-05 LAB — STREP B DNA PROBE: GBSP: NOT DETECTED

## 2014-07-21 ENCOUNTER — Telehealth: Payer: Self-pay | Admitting: Family Medicine

## 2014-07-21 ENCOUNTER — Ambulatory Visit (INDEPENDENT_AMBULATORY_CARE_PROVIDER_SITE_OTHER): Payer: Self-pay | Admitting: Family Medicine

## 2014-07-21 VITALS — BP 137/87 | HR 87 | Temp 98.3°F | Wt 162.0 lb

## 2014-07-21 DIAGNOSIS — Z3493 Encounter for supervision of normal pregnancy, unspecified, third trimester: Secondary | ICD-10-CM

## 2014-07-21 DIAGNOSIS — O26893 Other specified pregnancy related conditions, third trimester: Secondary | ICD-10-CM

## 2014-07-21 DIAGNOSIS — N898 Other specified noninflammatory disorders of vagina: Secondary | ICD-10-CM

## 2014-07-21 LAB — POCT WET PREP (WET MOUNT): CLUE CELLS WET PREP WHIFF POC: NEGATIVE

## 2014-07-21 LAB — POCT URINALYSIS DIPSTICK
Bilirubin, UA: NEGATIVE
Blood, UA: NEGATIVE
Glucose, UA: NEGATIVE
KETONES UA: NEGATIVE
Leukocytes, UA: NEGATIVE
Nitrite, UA: NEGATIVE
PH UA: 6.5
Protein, UA: NEGATIVE
Spec Grav, UA: 1.015
Urobilinogen, UA: 0.2

## 2014-07-21 MED ORDER — FLUCONAZOLE 150 MG PO TABS: 150.0000 mg | ORAL_TABLET | Freq: Once | ORAL | Status: DC

## 2014-07-21 NOTE — Progress Notes (Signed)
19 Y/O G1P0 @ 39 w GA here for routine prenatal care, denies any abdominal pain or contraction, no vaginal bleeding but positive vaginal discharge which is whitish and thick associated with dysuria. She is compliant with her PNV, no other concern today.  Exam: CV/Resp: wnl. Abd: Gravid, FH and FHR documented on the flow sheet.        Leopold's maneuver indicates vertex presentation.        Bedside U/S: Single intrauterine pregnancy with vertex presentation and visible cardiac activity. GU: about 0.5 cervical dilation, ++ thick curdy discharge. Ext: ++ edema, no tenderness.  A/P:19 Y/O G1 P0 at 3 w GA for routine prenatal care.        Pregnancy with normal progression.        Immunization and OB U/S reviewed.        PAP reviewed.        Patient with vaginal discharge,wet prep + for yeast.        Diflucan x 1 to be called in to her pharmacy and patient will be contacted to pick up her script.        Continue prenatal vitamins.        Labor precaution discussed.        BBP scheduled for next week.        F/U in 1 wk with PCP.

## 2014-07-21 NOTE — Telephone Encounter (Signed)
Patient contacted about + yeast infection and advised to pick up diflucan from the pharmacy. She verbalized understanding.

## 2014-07-21 NOTE — Patient Instructions (Signed)
Prenatal Care  WHAT IS PRENATAL CARE?  Prenatal care means health care during your pregnancy, before your baby is born. It is very important to take care of yourself and your baby during your pregnancy by:   Getting early prenatal care. If you know you are pregnant, or think you might be pregnant, call your health care provider as soon as possible. Schedule a visit for a prenatal exam.  Getting regular prenatal care. Follow your health care provider's schedule for blood and other necessary tests. Do not miss appointments.  Doing everything you can to keep yourself and your baby healthy during your pregnancy.  Getting complete care. Prenatal care should include evaluation of the medical, dietary, educational, psychological, and social needs of you and your significant other. The medical and genetic history of your family and the family of your baby's father should be discussed with your health care provider.  Discussing with your health care provider:  Prescription, over-the-counter, and herbal medicines that you take.  Any history of substance abuse, alcohol use, smoking, and illegal drug use.  Any history of domestic abuse and violence.  Immunizations you have received.  Your nutrition and diet.  The amount of exercise you do.  Any environmental and occupational hazards to which you are exposed.  History of sexually transmitted infections for both you and your partner.  Previous pregnancies you have had. WHY IS PRENATAL CARE SO IMPORTANT?  By regularly seeing your health care provider, you help ensure that problems can be identified early so that they can be treated as soon as possible. Other problems might be prevented. Many studies have shown that early and regular prenatal care is important for the health of mothers and their babies.  HOW CAN I TAKE CARE OF MYSELF WHILE I AM PREGNANT?  Here are ways to take care of yourself and your baby:   Start or continue taking your  multivitamin with 400 micrograms (mcg) of folic acid every day.  Get early and regular prenatal care. It is very important to see a health care provider during your pregnancy. Your health care provider will check at each visit to make sure that you and your baby are healthy. If there are any problems, action can be taken right away to help you and your baby.  Eat a healthy diet that includes:  Fruits.  Vegetables.  Foods low in saturated fat.  Whole grains.  Calcium-rich foods, such as milk, yogurt, and hard cheeses.  Drink 6-8 glasses of liquids a day.  Unless your health care provider tells you not to, try to be physically active for 30 minutes, most days of the week. If you are pressed for time, you can get your activity in through 10-minute segments, three times a day.  Do not smoke, drink alcohol, or use drugs. These can cause long-term damage to your baby. Talk with your health care provider about steps to take to stop smoking. Talk with a member of your faith community, a counselor, a trusted friend, or your health care provider if you are concerned about your alcohol or drug use.  Ask your health care provider before taking any medicine, even over-the-counter medicines. Some medicines are not safe to take during pregnancy.  Get plenty of rest and sleep.  Avoid hot tubs and saunas during pregnancy.  Do not have X-rays taken unless absolutely necessary and with the recommendation of your health care provider. A lead shield can be placed on your abdomen to protect your baby when   X-rays are taken in other parts of your body.  Do not empty the cat litter when you are pregnant. It may contain a parasite that causes an infection called toxoplasmosis, which can cause birth defects. Also, use gloves when working in garden areas used by cats.  Do not eat uncooked or undercooked meats or fish.  Do not eat soft, mold-ripened cheeses (Brie, Camembert, and chevre) or soft, blue-veined  cheese (Danish blue and Roquefort).  Stay away from toxic chemicals like:  Insecticides.  Solvents (some cleaners or paint thinners).  Lead.  Mercury.  Sexual intercourse may continue until the end of the pregnancy, unless you have a medical problem or there is a problem with the pregnancy and your health care provider tells you not to.  Do not wear high-heel shoes, especially during the second half of the pregnancy. You can lose your balance and fall.  Do not take long trips, unless absolutely necessary. Be sure to see your health care provider before going on the trip.  Do not sit in one position for more than 2 hours when on a trip.  Take a copy of your medical records when going on a trip. Know where a hospital is located in the city you are visiting, in case of an emergency.  Most dangerous household products will have pregnancy warnings on their labels. Ask your health care provider about products if you are unsure.  Limit or eliminate your caffeine intake from coffee, tea, sodas, medicines, and chocolate.  Many women continue working through pregnancy. Staying active might help you stay healthier. If you have a question about the safety or the hours you work at your particular job, talk with your health care provider.  Get informed:  Read books.  Watch videos.  Go to childbirth classes for you and your significant other.  Talk with experienced moms.  Ask your health care provider about childbirth education classes for you and your partner. Classes can help you and your partner prepare for the birth of your baby.  Ask about a baby doctor (pediatrician) and methods and pain medicine for labor, delivery, and possible cesarean delivery. HOW OFTEN SHOULD I SEE MY HEALTH CARE PROVIDER DURING PREGNANCY?  Your health care provider will give you a schedule for your prenatal visits. You will have visits more often as you get closer to the end of your pregnancy. An average  pregnancy lasts about 40 weeks.  A typical schedule includes visiting your health care provider:   About once each month during your first 6 months of pregnancy.  Every 2 weeks during the next 2 months.  Weekly in the last month, until the delivery date. Your health care provider will probably want to see you more often if:  You are older than 35 years.  Your pregnancy is high risk because you have certain health problems or problems with the pregnancy, such as:  Diabetes.  High blood pressure.  The baby is not growing on schedule, according to the dates of the pregnancy. Your health care provider will do special tests to make sure you and your baby are not having any serious problems. WHAT HAPPENS DURING PRENATAL VISITS?   At your first prenatal visit, your health care provider will do a physical exam and talk to you about your health history and the health history of your partner and your family. Your health care provider will be able to tell you what date to expect your baby to be born on.  Your   first physical exam will include checks of your blood pressure, measurements of your height and weight, and an exam of your pelvic organs. Your health care provider will do a Pap test if you have not had one recently and will do cultures of your cervix to make sure there is no infection.  At each prenatal visit, there will be tests of your blood, urine, blood pressure, weight, and the progress of the baby will be checked.  At your later prenatal visits, your health care provider will check how you are doing and how your baby is developing. You may have a number of tests done as your pregnancy progresses.  Ultrasound exams are often used to check on your baby's growth and health.  You may have more urine and blood tests, as well as special tests, if needed. These may include amniocentesis to examine fluid in the pregnancy sac, stress tests to check how the baby responds to contractions, or a  biophysical profile to measure your baby's well-being. Your health care provider will explain the tests and why they are necessary.  You should be tested for high blood sugar (gestational diabetes) between the 24th and 28th weeks of your pregnancy.  You should discuss with your health care provider your plans to breastfeed or bottle-feed your baby.  Each visit is also a chance for you to learn about staying healthy during pregnancy and to ask questions. Document Released: 01/17/2003 Document Revised: 01/19/2013 Document Reviewed: 03/31/2013 ExitCare Patient Information 2015 [employer]. This information is not intended to replace advice given to you by your health care provider. Make sure you discuss any questions you have with your health care provider.  

## 2014-07-21 NOTE — Addendum Note (Signed)
Addended by: Janit Pagan T on: 07/21/2014 11:23 AM   Modules accepted: Orders

## 2014-07-27 ENCOUNTER — Inpatient Hospital Stay (HOSPITAL_COMMUNITY)
Admission: AD | Admit: 2014-07-27 | Discharge: 2014-07-27 | Disposition: A | Discharge status: Home / Self Care | Payer: Medicaid Other | Source: Ambulatory Visit | Attending: Family Medicine | Admitting: Family Medicine

## 2014-07-27 ENCOUNTER — Encounter (HOSPITAL_COMMUNITY): Payer: Self-pay | Admitting: *Deleted

## 2014-07-27 DIAGNOSIS — Z3493 Encounter for supervision of normal pregnancy, unspecified, third trimester: Secondary | ICD-10-CM | POA: Diagnosis present

## 2014-07-27 DIAGNOSIS — Z3A40 40 weeks gestation of pregnancy: Secondary | ICD-10-CM | POA: Insufficient documentation

## 2014-07-27 NOTE — Discharge Instructions (Signed)
Braxton Hicks Contractions °Contractions of the uterus can occur throughout pregnancy. Contractions are not always a sign that you are in labor.  °WHAT ARE BRAXTON HICKS CONTRACTIONS?  °Contractions that occur before labor are called Braxton Hicks contractions, or false labor. Toward the end of pregnancy (32-34 weeks), these contractions can develop more often and may become more forceful. This is not true labor because these contractions do not result in opening (dilatation) and thinning of the cervix. They are sometimes difficult to tell apart from true labor because these contractions can be forceful and people have different pain tolerances. You should not feel embarrassed if you go to the hospital with false labor. Sometimes, the only way to tell if you are in true labor is for your health care provider to look for changes in the cervix. °If there are no prenatal problems or other health problems associated with the pregnancy, it is completely safe to be sent home with false labor and await the onset of true labor. °HOW CAN YOU TELL THE DIFFERENCE BETWEEN TRUE AND FALSE LABOR? °False Labor °· The contractions of false labor are usually shorter and not as hard as those of true labor.   °· The contractions are usually irregular.   °· The contractions are often felt in the front of the lower abdomen and in the groin.   °· The contractions may go away when you walk around or change positions while lying down.   °· The contractions get weaker and are shorter lasting as time goes on.   °· The contractions do not usually become progressively stronger, regular, and closer together as with true labor.   °True Labor °· Contractions in true labor last 30-70 seconds, become very regular, usually become more intense, and increase in frequency.   °· The contractions do not go away with walking.   °· The discomfort is usually felt in the top of the uterus and spreads to the lower abdomen and low back.   °· True labor can be  determined by your health care provider with an exam. This will show that the cervix is dilating and getting thinner.   °WHAT TO REMEMBER °· Keep up with your usual exercises and follow other instructions given by your health care provider.   °· Take medicines as directed by your health care provider.   °· Keep your regular prenatal appointments.   °· Eat and drink lightly if you think you are going into labor.   °· If Braxton Hicks contractions are making you uncomfortable:   °¨ Change your position from lying down or resting to walking, or from walking to resting.   °¨ Sit and rest in a tub of warm water.   °¨ Drink 2-3 glasses of water. Dehydration may cause these contractions.   °¨ Do slow and deep breathing several times an hour.   °WHEN SHOULD I SEEK IMMEDIATE MEDICAL CARE? °Seek immediate medical care if: °· Your contractions become stronger, more regular, and closer together.   °· You have fluid leaking or gushing from your vagina.   °· You have a fever.   °· You pass blood-tinged mucus.   °· You have vaginal bleeding.   °· You have continuous abdominal pain.   °· You have low back pain that you never had before.   °· You feel your baby's head pushing down and causing pelvic pressure.   °· Your baby is not moving as much as it used to.   °Document Released: 01/14/2005 Document Revised: 01/19/2013 Document Reviewed: 10/26/2012 °ExitCare® Patient Information ©2015 [employer]. This information is not intended to replace advice given to you by your health care   provider. Make sure you discuss any questions you have with your health care provider. ° °

## 2014-07-27 NOTE — MAU Note (Signed)
PT  SAYS SHE  HURT BAD  [place]INCE [place].  VE IN [facility]   CLOSED .  DENIES HSV AND MRSA.   GBS-POSITIVE.

## 2014-07-27 NOTE — MAU Note (Signed)
Pt to be discharged to home with labor instructions and when to return.

## 2014-07-28 ENCOUNTER — Other Ambulatory Visit: Payer: Self-pay | Admitting: Family Medicine

## 2014-07-28 DIAGNOSIS — O480 Post-term pregnancy: Secondary | ICD-10-CM

## 2014-07-29 ENCOUNTER — Inpatient Hospital Stay (HOSPITAL_COMMUNITY): Payer: Medicaid Other | Admitting: Anesthesiology

## 2014-07-29 ENCOUNTER — Ambulatory Visit (HOSPITAL_COMMUNITY): Admission: RE | Admit: 2014-07-29 | Payer: Self-pay | Source: Ambulatory Visit

## 2014-07-29 ENCOUNTER — Encounter (HOSPITAL_COMMUNITY): Payer: Self-pay | Admitting: *Deleted

## 2014-07-29 ENCOUNTER — Inpatient Hospital Stay (HOSPITAL_COMMUNITY)
Admission: AD | Admit: 2014-07-29 | Discharge: 2014-08-01 | DRG: 774 | Disposition: A | Discharge status: Home / Self Care | Payer: Medicaid Other | Source: Ambulatory Visit | Attending: Family Medicine | Admitting: Family Medicine

## 2014-07-29 DIAGNOSIS — O1490 Unspecified pre-eclampsia, unspecified trimester: Secondary | ICD-10-CM

## 2014-07-29 DIAGNOSIS — O1403 Mild to moderate pre-eclampsia, third trimester: Secondary | ICD-10-CM | POA: Diagnosis present

## 2014-07-29 DIAGNOSIS — O09299 Supervision of pregnancy with other poor reproductive or obstetric history, unspecified trimester: Secondary | ICD-10-CM | POA: Diagnosis present

## 2014-07-29 DIAGNOSIS — Z3A40 40 weeks gestation of pregnancy: Secondary | ICD-10-CM | POA: Diagnosis present

## 2014-07-29 DIAGNOSIS — Z8249 Family history of ischemic heart disease and other diseases of the circulatory system: Secondary | ICD-10-CM | POA: Diagnosis not present

## 2014-07-29 DIAGNOSIS — IMO0001 Reserved for inherently not codable concepts without codable children: Secondary | ICD-10-CM

## 2014-07-29 HISTORY — DX: Unspecified pre-eclampsia, unspecified trimester: O14.90

## 2014-07-29 LAB — CBC
HCT: 33.5 % — ABNORMAL LOW (ref 36.0–46.0)
HEMATOCRIT: 34.2 % — AB (ref 36.0–46.0)
Hemoglobin: 10.9 g/dL — ABNORMAL LOW (ref 12.0–15.0)
Hemoglobin: 11 g/dL — ABNORMAL LOW (ref 12.0–15.0)
MCH: 29.9 pg (ref 26.0–34.0)
MCH: 29.6 pg (ref 26.0–34.0)
MCHC: 32.5 g/dL (ref 30.0–36.0)
MCHC: 32.2 g/dL (ref 30.0–36.0)
MCV: 91.8 fL (ref 78.0–100.0)
MCV: 92.2 fL (ref 78.0–100.0)
Platelets: 140 10*3/uL — ABNORMAL LOW (ref 150–400)
Platelets: 134 10*3/uL — ABNORMAL LOW (ref 150–400)
RBC: 3.65 MIL/uL — AB (ref 3.87–5.11)
RBC: 3.71 MIL/uL — ABNORMAL LOW (ref 3.87–5.11)
RDW: 13.9 % (ref 11.5–15.5)
RDW: 13.9 % (ref 11.5–15.5)
WBC: 11.5 10*3/uL — AB (ref 4.0–10.5)
WBC: 15 10*3/uL — ABNORMAL HIGH (ref 4.0–10.5)

## 2014-07-29 LAB — PROTEIN / CREATININE RATIO, URINE
CREATININE, URINE: 36 mg/dL
PROTEIN CREATININE RATIO: 0.67 mg/mg{creat} — AB (ref 0.00–0.15)
Total Protein, Urine: 24 mg/dL

## 2014-07-29 LAB — COMPREHENSIVE METABOLIC PANEL
ALBUMIN: 2.9 g/dL — AB (ref 3.5–5.0)
ALK PHOS: 152 U/L — AB (ref 38–126)
ALT: 20 U/L (ref 14–54)
AST: 22 U/L (ref 15–41)
Anion gap: 6 (ref 5–15)
BILIRUBIN TOTAL: 0.2 mg/dL — AB (ref 0.3–1.2)
BUN: 9 mg/dL (ref 6–20)
CALCIUM: 8.2 mg/dL — AB (ref 8.9–10.3)
CHLORIDE: 106 mmol/L (ref 101–111)
CO2: 23 mmol/L (ref 22–32)
Creatinine, Ser: 0.52 mg/dL (ref 0.44–1.00)
GFR calc non Af Amer: >60 mL/min (ref >60)
Glucose, Bld: 87 mg/dL (ref 65–99)
POTASSIUM: 4.1 mmol/L (ref 3.5–5.1)
Sodium: 135 mmol/L (ref 135–145)
TOTAL PROTEIN: 6.8 g/dL (ref 6.5–8.1)

## 2014-07-29 LAB — TYPE AND SCREEN
ABO/RH(D): O POS
Antibody Screen: NEGATIVE

## 2014-07-29 LAB — ABO/RH: ABO/RH(D): O POS

## 2014-07-29 MED ORDER — LIDOCAINE-EPINEPHRINE (PF) 2 %-1:200000 IJ SOLN
INTRAMUSCULAR | Status: DC | PRN
  Administered 2014-07-29: 3 mL

## 2014-07-29 MED ORDER — OXYTOCIN 40 UNITS IN LACTATED RINGERS INFUSION - SIMPLE MED: 62.5000 mL/h | INTRAVENOUS | Status: DC

## 2014-07-29 MED ORDER — OXYCODONE-ACETAMINOPHEN 5-325 MG PO TABS
2.0000 | ORAL_TABLET | Freq: Once | ORAL | Status: AC
Start: 2014-07-29 — End: 2014-07-29
  Administered 2014-07-29: 2 via ORAL
  Filled 2014-07-29: qty 2

## 2014-07-29 MED ORDER — BUPIVACAINE HCL (PF) 0.75 % IJ SOLN
14.0000 mL/h | INTRAMUSCULAR | Status: DC | PRN
Start: 2014-07-29 — End: 2014-07-30
  Filled 2014-07-29 (×3): qty 17

## 2014-07-29 MED ORDER — OXYTOCIN 40 UNITS IN LACTATED RINGERS INFUSION - SIMPLE MED
1.0000 m[IU]/min | INTRAVENOUS | Status: DC
  Administered 2014-07-29: 1 m[IU]/min via INTRAVENOUS
  Filled 2014-07-29: qty 1000

## 2014-07-29 MED ORDER — LACTATED RINGERS IV SOLN
500.0000 mL | INTRAVENOUS | Status: DC | PRN
  Administered 2014-07-29 (×2): 500 mL via INTRAVENOUS

## 2014-07-29 MED ORDER — LACTATED RINGERS IV SOLN
INTRAVENOUS | Status: DC
  Administered 2014-07-29 – 2014-07-30 (×2): via INTRAUTERINE

## 2014-07-29 MED ORDER — PENICILLIN G POTASSIUM 5000000 UNITS IJ SOLR
2.5000 10*6.[IU] | INTRAVENOUS | Status: DC
  Filled 2014-07-29 (×3): qty 2.5

## 2014-07-29 MED ORDER — OXYTOCIN BOLUS FROM INFUSION
500.0000 mL | INTRAVENOUS | Status: DC
  Administered 2014-07-30: 500 mL via INTRAVENOUS

## 2014-07-29 MED ORDER — PHENYLEPHRINE 40 MCG/ML (10ML) SYRINGE FOR IV PUSH (FOR BLOOD PRESSURE SUPPORT)
80.0000 ug | PREFILLED_SYRINGE | INTRAVENOUS | Status: DC | PRN
  Filled 2014-07-29: qty 20
  Filled 2014-07-29: qty 2

## 2014-07-29 MED ORDER — EPHEDRINE 5 MG/ML INJ
10.0000 mg | INTRAVENOUS | Status: DC | PRN
  Filled 2014-07-29: qty 2

## 2014-07-29 MED ORDER — ACETAMINOPHEN 325 MG PO TABS: 650.0000 mg | ORAL_TABLET | ORAL | Status: DC | PRN

## 2014-07-29 MED ORDER — BUPIVACAINE HCL (PF) 0.25 % IJ SOLN
INTRAMUSCULAR | Status: DC | PRN
  Administered 2014-07-29 (×2): 4 mL

## 2014-07-29 MED ORDER — CITRIC ACID-SODIUM CITRATE 334-500 MG/5ML PO SOLN
30.0000 mL | ORAL | Status: DC | PRN
  Filled 2014-07-29: qty 15

## 2014-07-29 MED ORDER — LIDOCAINE HCL (PF) 1 % IJ SOLN
30.0000 mL | INTRAMUSCULAR | Status: DC | PRN
  Filled 2014-07-29: qty 30

## 2014-07-29 MED ORDER — DIPHENHYDRAMINE HCL 50 MG/ML IJ SOLN: 12.5000 mg | INTRAMUSCULAR | Status: DC | PRN

## 2014-07-29 MED ORDER — PENICILLIN G POTASSIUM 5000000 UNITS IJ SOLR
5.0000 10*6.[IU] | Freq: Once | INTRAVENOUS | Status: DC
  Filled 2014-07-29: qty 5

## 2014-07-29 MED ORDER — TERBUTALINE SULFATE 1 MG/ML IJ SOLN
0.2500 mg | Freq: Once | INTRAMUSCULAR | Status: AC | PRN
  Filled 2014-07-29: qty 1

## 2014-07-29 MED ORDER — FENTANYL CITRATE (PF) 100 MCG/2ML IJ SOLN
100.0000 ug | INTRAMUSCULAR | Status: DC | PRN
  Administered 2014-07-29: 100 ug via INTRAVENOUS
  Filled 2014-07-29: qty 2

## 2014-07-29 MED ORDER — LACTATED RINGERS IV SOLN
INTRAVENOUS | Status: DC
  Administered 2014-07-29 – 2014-07-30 (×4): via INTRAVENOUS

## 2014-07-29 MED ORDER — FENTANYL 2.5 MCG/ML BUPIVACAINE 1/10 % EPIDURAL INFUSION (WH - ANES)
14.0000 mL/h | INTRAMUSCULAR | Status: DC | PRN
  Administered 2014-07-29: 12 mL/h via EPIDURAL
  Administered 2014-07-30: 14 mL/h via EPIDURAL
  Filled 2014-07-29 (×2): qty 125

## 2014-07-29 MED ORDER — OXYCODONE-ACETAMINOPHEN 5-325 MG PO TABS: 2.0000 | ORAL_TABLET | ORAL | Status: DC | PRN

## 2014-07-29 MED ORDER — ONDANSETRON HCL 4 MG/2ML IJ SOLN: 4.0000 mg | Freq: Four times a day (QID) | INTRAMUSCULAR | Status: DC | PRN

## 2014-07-29 MED ORDER — OXYCODONE-ACETAMINOPHEN 5-325 MG PO TABS: 1.0000 | ORAL_TABLET | ORAL | Status: DC | PRN

## 2014-07-29 NOTE — Progress Notes (Signed)
Krystal Wood is a 19 y.o. G1P0 at 3665w1d by ultrasound admitted for active labor  Subjective:  Resting comfortably, continues to have contractions   Objective: BP 148/92 mmHg  Pulse 86  Temp(Src) 98 F (36.7 C) (Oral)  Resp 18  Ht 5\' 1"  (1.549 m)  Wt 165 lb (74.844 kg)  BMI 31.19 kg/m2  SpO2 99%  LMP 11/25/2013 (Approximate)      FHT:  FHR: 150 bpm, variability: moderate,  accelerations:  Present,  decelerations:  Present earlys UC:   regular, every 8 minutes SVE:   Dilation: 4 Effacement (%): 90 Station: -1, 0 Exam by:: hk  Labs: Lab Results  Component Value Date   WBC 11.5* 07/29/2014   HGB 10.9* 07/29/2014   HCT 33.5* 07/29/2014   MCV 91.8 07/29/2014   PLT 140* 07/29/2014    Assessment / Plan: Augmentation of labor, progressing well - on pitocin  Labor: progressing on pitocin s/p AROM Preeclampsia:  no signs or symptoms of toxicity and no severe range BP, not on magnesium Fetal Wellbeing:  Category I Pain Control:  Fentanyl I/D:  GBS negative Anticipated MOD:  NSVD  Krystal Wood 07/29/2014, 8:46 PM

## 2014-07-29 NOTE — H&P (Signed)
Krystal Wood is a 19 y.o. female G1P0 at 10341w1d by 24w ultrasound presenting for painful contractions since midnight, approximately every 5 minutes. No bleeding, LOF. Normal fetal movement. . History OB History    Gravida Para Term Preterm AB TAB SAB Ectopic Multiple Living   1 0             History reviewed. No pertinent past medical history. Past Surgical History  Procedure Laterality Date  . No past surgeries     Family History: family history includes Hypertension in her mother. Social History:  reports that she has never smoked. She has never used smokeless tobacco. She reports that she does not drink alcohol or use illicit drugs.   Prenatal Transfer Tool  Maternal Diabetes: No Genetic Screening: Declined Maternal Ultrasounds/Referrals: Normal Fetal Ultrasounds or other Referrals:  None Maternal Substance Abuse:  No Significant Maternal Medications:  None Significant Maternal Lab Results:  None Other Comments:  None  Review of Systems  Constitutional: Negative for fever.  Gastrointestinal: Positive for abdominal pain.  Musculoskeletal: Positive for back pain.  All other systems reviewed and are negative.   Dilation: 3.5 Effacement (%): 80 Station: -2 Exam by:: Krystal KehrNicole Dunbar RN  Blood pressure 151/103, pulse 89, temperature 98 F (36.7 C), temperature source Oral, resp. rate 18, height 5\' 1"  (1.549 m), weight 74.844 kg (165 lb), last menstrual period 11/25/2013, unknown if currently breastfeeding. Maternal Exam:  Uterine Assessment: Contraction strength is moderate.  Contraction frequency is regular.   Abdomen: Patient reports no abdominal tenderness. Introitus: Vagina is negative for discharge.  Pelvis: adequate for delivery.      Fetal Exam Fetal Monitor Review: Mode: ultrasound.   Baseline rate: 140.  Variability: moderate (6-25 bpm).   Pattern: accelerations present and early decelerations.    Fetal State Assessment: Category I - tracings are  normal.     Physical Exam  Nursing note and vitals reviewed. Constitutional: She is oriented to person, place, and time. She appears well-developed and well-nourished. No distress.  HENT:  Head: Normocephalic and atraumatic.  Eyes: Conjunctivae and EOM are normal. Right eye exhibits no discharge. Left eye exhibits no discharge. No scleral icterus.  Cardiovascular: Normal rate.   Respiratory: Effort normal. No respiratory distress.  GI: Soft. She exhibits no distension. There is no tenderness.  Genitourinary: Vagina normal and uterus normal. No vaginal discharge found.  Musculoskeletal: Normal range of motion.  Neurological: She is alert and oriented to person, place, and time.  Skin: Skin is warm and dry. No rash noted. She is not diaphoretic.  Psychiatric: She has a normal mood and affect. Her behavior is normal.    Prenatal labs: ABO, Rh: O/POS/-- (02/05 1404) Antibody: NEG (02/05 1404) Rubella: 4.07 (02/05 1404) RPR: NON REAC (04/21 1059)  HBsAg: NEGATIVE (02/05 1404)  HIV: NONREACTIVE (04/21 1059)  GBS: NOT DETECTED (06/06 1436)   Assessment/Plan: LaborAdmitPlan #Labor: Active labor at term, expectant management #Pain: considering epidural #FWB: cat 1 #ID: GBS negative     Krystal Wood 07/29/2014, 2:45 PM    OB fellow attestation:  I have seen and examined this patient; I agree with above documentation in the resident's note.   Krystal Wood is a 19 y.o. G1P0 here for labor evaluation with variables with each contraction  PE: BP 137/88 mmHg  Pulse 76  Temp(Src) 98 F (36.7 C) (Oral)  Resp 18  Ht 5\' 1"  (1.549 m)  Wt 165 lb (74.844 kg)  BMI 31.19 kg/m2  LMP 11/25/2013 (Approximate) Gen: calm  comfortable, NAD Resp: normal effort, no distress Abd: gravid  ROS, labs, PMH reviewed  Plan: ID: GBS neg FWB: cat I Labor: AROM and pit Pain: epidural upon request elev BP => uPC 0.67 => preE with mild fx, will mag if indicated  Krystal Wood  Krystal Wood 07/29/2014, 4:16 PM

## 2014-07-29 NOTE — MAU Note (Signed)
Contractions started at 0100, now every 3 min.  Some bleeding, no leaking. No problems with preg.

## 2014-07-29 NOTE — Anesthesia Preprocedure Evaluation (Signed)
Anesthesia Evaluation  Patient identified by MRN, date of birth, ID band Patient awake    Reviewed: Allergy & Precautions, Patient's Chart, lab work & pertinent test results  History of Anesthesia Complications Negative for: history of anesthetic complications  Airway Mallampati: III  TM Distance: >3 FB Neck ROM: Full    Dental  (+) Teeth Intact   Pulmonary neg pulmonary ROS,  breath sounds clear to auscultation        Cardiovascular hypertension, negative cardio ROS  Rhythm:Regular     Neuro/Psych negative neurological ROS  negative psych ROS   GI/Hepatic negative GI ROS, Neg liver ROS,   Endo/Other  negative endocrine ROS  Renal/GU negative Renal ROS     Musculoskeletal   Abdominal   Peds  Hematology negative hematology ROS (+)   Anesthesia Other Findings   Reproductive/Obstetrics (+) Pregnancy                             Anesthesia Physical Anesthesia Plan  ASA: II  Anesthesia Plan: Epidural   Post-op Pain Management:    Induction:   Airway Management Planned:   Additional Equipment:   Intra-op Plan:   Post-operative Plan:   Informed Consent: I have reviewed the patients History and Physical, chart, labs and discussed the procedure including the risks, benefits and alternatives for the proposed anesthesia with the patient or authorized representative who has indicated his/her understanding and acceptance.   Dental advisory given  Plan Discussed with: Anesthesiologist  Anesthesia Plan Comments:         Anesthesia Quick Evaluation

## 2014-07-29 NOTE — Progress Notes (Signed)
Patient ID: [name], female   DOB: 1995-12-26, 19 y.o.   MRN: [id number] Doing well, hurting in her back  Filed Vitals:   07/29/14 1933 07/29/14 2003 07/29/14 2018 07/29/14 2101  BP:   148/92 158/93  Pulse:  81 86 86  Temp:    98 F (36.7 C)  TempSrc:    Oral  Resp: 18  18 20   Height:      Weight:      SpO2:  98% 99%    FHR reassuring UCs increasing in frequency  Dilation: 5.5 Effacement (%): 90 Cervical Position: Posterior Station: -1, -2 Presentation: Vertex Exam by:: e. poore, rn  Discussed analgesia options Desires epidural

## 2014-07-29 NOTE — Anesthesia Procedure Notes (Signed)
Epidural Patient location during procedure: OB  Staffing Anesthesiologist: Eileene Kisling, CHRIS Performed by: anesthesiologist   Preanesthetic Checklist Completed: patient identified, surgical consent, pre-op evaluation, timeout performed, IV checked, risks and benefits discussed and monitors and equipment checked  Epidural Patient position: sitting Prep: site prepped and draped and DuraPrep Patient monitoring: heart rate, cardiac monitor, continuous pulse ox and blood pressure Approach: midline Location: L4-L5 Injection technique: LOR saline  Needle:  Needle type: Tuohy  Needle gauge: 17 G Needle length: 9 cm Needle insertion depth: 7 cm Catheter type: closed end flexible Catheter size: 19 Gauge Catheter at skin depth: 13 cm Test dose: negative and 2% lidocaine with Epi 1:200 K  Assessment Events: blood not aspirated, injection not painful, no injection resistance, negative IV test and no paresthesia  Additional Notes H+P and labs checked, risks and benefits discussed with the patient, consent obtained, procedure tolerated well and without complications.  Reason for block:procedure for pain   

## 2014-07-30 ENCOUNTER — Encounter (HOSPITAL_COMMUNITY): Payer: Self-pay | Admitting: *Deleted

## 2014-07-30 DIAGNOSIS — O1403 Mild to moderate pre-eclampsia, third trimester: Secondary | ICD-10-CM

## 2014-07-30 DIAGNOSIS — Z8249 Family history of ischemic heart disease and other diseases of the circulatory system: Secondary | ICD-10-CM

## 2014-07-30 DIAGNOSIS — Z3A40 40 weeks gestation of pregnancy: Secondary | ICD-10-CM

## 2014-07-30 LAB — HIV ANTIBODY (ROUTINE TESTING W REFLEX): HIV SCREEN 4TH GENERATION: NONREACTIVE

## 2014-07-30 LAB — RPR: RPR: NONREACTIVE

## 2014-07-30 MED ORDER — TETANUS-DIPHTH-ACELL PERTUSSIS 5-2.5-18.5 LF-MCG/0.5 IM SUSP: 0.5000 mL | Freq: Once | INTRAMUSCULAR | Status: DC

## 2014-07-30 MED ORDER — ONDANSETRON HCL 4 MG PO TABS: 4.0000 mg | ORAL_TABLET | ORAL | Status: DC | PRN

## 2014-07-30 MED ORDER — ONDANSETRON HCL 4 MG/2ML IJ SOLN: 4.0000 mg | INTRAMUSCULAR | Status: DC | PRN

## 2014-07-30 MED ORDER — DIPHENHYDRAMINE HCL 25 MG PO CAPS: 25.0000 mg | ORAL_CAPSULE | Freq: Four times a day (QID) | ORAL | Status: DC | PRN

## 2014-07-30 MED ORDER — ACETAMINOPHEN 325 MG PO TABS: 650.0000 mg | ORAL_TABLET | ORAL | Status: DC | PRN

## 2014-07-30 MED ORDER — DIBUCAINE 1 % RE OINT: 1.0000 "application " | TOPICAL_OINTMENT | RECTAL | Status: DC | PRN

## 2014-07-30 MED ORDER — WITCH HAZEL-GLYCERIN EX PADS: 1.0000 "application " | MEDICATED_PAD | CUTANEOUS | Status: DC | PRN

## 2014-07-30 MED ORDER — BENZOCAINE-MENTHOL 20-0.5 % EX AERO
1.0000 "application " | INHALATION_SPRAY | CUTANEOUS | Status: DC | PRN
  Administered 2014-07-30: 1 via TOPICAL
  Filled 2014-07-30: qty 56

## 2014-07-30 MED ORDER — TERBUTALINE SULFATE 1 MG/ML IJ SOLN
0.2500 mg | Freq: Once | INTRAMUSCULAR | Status: AC
  Administered 2014-07-30: 0.25 mg via SUBCUTANEOUS

## 2014-07-30 MED ORDER — ZOLPIDEM TARTRATE 5 MG PO TABS: 5.0000 mg | ORAL_TABLET | Freq: Every evening | ORAL | Status: DC | PRN

## 2014-07-30 MED ORDER — LANOLIN HYDROUS EX OINT: TOPICAL_OINTMENT | CUTANEOUS | Status: DC | PRN

## 2014-07-30 MED ORDER — SIMETHICONE 80 MG PO CHEW: 80.0000 mg | CHEWABLE_TABLET | ORAL | Status: DC | PRN

## 2014-07-30 MED ORDER — OXYTOCIN 40 UNITS IN LACTATED RINGERS INFUSION - SIMPLE MED: 1.0000 m[IU]/min | INTRAVENOUS | Status: DC

## 2014-07-30 MED ORDER — OXYCODONE-ACETAMINOPHEN 5-325 MG PO TABS: 2.0000 | ORAL_TABLET | ORAL | Status: DC | PRN

## 2014-07-30 MED ORDER — SENNOSIDES-DOCUSATE SODIUM 8.6-50 MG PO TABS
2.0000 | ORAL_TABLET | ORAL | Status: DC
  Administered 2014-07-31 – 2014-08-01 (×2): 2 via ORAL
  Filled 2014-07-30 (×2): qty 2

## 2014-07-30 MED ORDER — OXYCODONE-ACETAMINOPHEN 5-325 MG PO TABS
1.0000 | ORAL_TABLET | ORAL | Status: DC | PRN
  Administered 2014-07-30 – 2014-07-31 (×2): 1 via ORAL
  Filled 2014-07-30 (×2): qty 1

## 2014-07-30 MED ORDER — IBUPROFEN 600 MG PO TABS
600.0000 mg | ORAL_TABLET | Freq: Four times a day (QID) | ORAL | Status: DC
  Administered 2014-07-30 – 2014-08-01 (×9): 600 mg via ORAL
  Filled 2014-07-30 (×9): qty 1

## 2014-07-30 MED ORDER — PRENATAL MULTIVITAMIN CH
1.0000 | ORAL_TABLET | Freq: Every day | ORAL | Status: DC
  Administered 2014-07-30 – 2014-08-01 (×3): 1 via ORAL
  Filled 2014-07-30 (×3): qty 1

## 2014-07-30 NOTE — Anesthesia Postprocedure Evaluation (Signed)
  Anesthesia Post-op Note  Patient: Krystal Wood  Procedure(s) Performed: * No procedures listed *  Patient Location: Mother/Baby  Anesthesia Type:Epidural  Level of Consciousness: awake  Airway and Oxygen Therapy: Patient Spontanous Breathing  Post-op Pain: mild  Post-op Assessment: Patient's Cardiovascular Status Stable and Respiratory Function Stable              Post-op Vital Signs: stable  Last Vitals:  Filed Vitals:   07/30/14 1220  BP: 127/84  Pulse: 88  Temp: 36.8 C  Resp: 16    Complications: No apparent anesthesia complications

## 2014-07-30 NOTE — Progress Notes (Signed)
Filed Vitals:   07/29/14 2325 07/29/14 2326 07/29/14 2330 07/29/14 2331  BP:  126/67  123/77  Pulse: 75 73 77 79  Temp:      TempSrc:      Resp:    18  Height:      Weight:      SpO2:        Dilation: 5.5 Effacement (%): 90 Cervical Position: Posterior Station: 0 Presentation: Vertex Exam by:: e. poore, rn  FHR: previously with late decels x2, now resolved with position change, good variaility, +accels  UCs q3-8 mins - irregular  Recently had epidural placed Continue to increase pitocin Continue to monitor labor

## 2014-07-30 NOTE — Lactation Note (Signed)
This note was copied from the chart of [name]. Lactation Consultation Note  Patient Name: [name] ZOXWR'UToday's Date: 07/30/2014 Reason for consult: Initial assessment  Baby 10  1/2 hours old, per mom last at the breast at 5 pm.  Baby not showing feeding cues at the start of consult. Per mom " I don't have any milk ".  Mom reports breast changes with pregnancy and leakage last few weeks. LC showed mom how to hand express, and mom returned demo. Several drops , LC applied to baby's lips and baby woke up and started rooting. LC assisted with latch on both breast , Baby latched with depth and multiply swallows  Noted, increased with breast compressions .  LC did assess baby's oral cavity and noted a short labial frenulum, and stretches upper lip  Enough some with exam , and the breast , stretched better. Short lingual frenulum noted. AS of now isn't interfering with latch and sustaining it. See Dod flow sheets for details. Mother informed of post-discharge support and given phone number to the lactation department, including  services for phone call assistance; out-patient appointments; and breastfeeding support group. List of other  breastfeeding resources in the community given in the handout. Encouraged mother to call for problems or  concerns related to breastfeeding.   Maternal Data Has patient been taught Hand Expression?: Yes Does the patient have breastfeeding experience prior to this delivery?: No  Feeding Feeding Type: Breast Fed Length of feed:  (still feeding at 5 mins with swallows )  LATCH Score/Interventions Latch: Repeated attempts needed to sustain latch, nipple held in mouth throughout feeding, stimulation needed to elicit sucking reflex. Intervention(s): Adjust position;Assist with latch;Breast massage;Breast compression  Audible Swallowing: Spontaneous and intermittent  Type of Nipple: Everted at rest and after stimulation  Comfort  (Breast/Nipple): Soft / non-tender     Hold (Positioning): Assistance needed to correctly position infant at breast and maintain latch. Intervention(s): Breastfeeding basics reviewed;Support Pillows;Position options;Skin to skin  LATCH Score: 8  Lactation Tools Discussed/Used     Consult Status Consult Status: Follow-up Date: 07/31/14 Follow-up type: In-patient    Kathrin Greathouseorio, [name] 07/30/2014, 8:29 PM

## 2014-07-30 NOTE — Progress Notes (Signed)
Patient ID: Joneen CarawayZainab Windsor, female   DOB: 1995/12/03, 19 y.o.   MRN: [id number] Filed Vitals:   07/30/14 0431 07/30/14 0500 07/30/14 0531 07/30/14 0601  BP: 125/77 137/79 149/91 145/84  Pulse: 103 98 98 102  Temp:      TempSrc:      Resp: 18 18 18 20   Height:      Weight:      SpO2:       Fetal heart rate has been more reassuring through the night There have been intermittent variable decels with some contractions She now has questionable variable decels with contractions   Pitocin is at  2 mu/min   Dr Adrian BlackwaterStinson updated

## 2014-07-30 NOTE — Progress Notes (Signed)
Patient ID: [name], female   DOB: 1996/01/27, 19 y.o.   MRN: [id number]  AVSS  Comfortable with epidural  Fetal heart rate reassuring but she does have intermittent deep variable decels  Position changes improve them somewhat  Dr Adrian BlackwaterStinson update, will continue to observe

## 2014-07-31 NOTE — Progress Notes (Signed)
Post Partum Day 1 Subjective: no complaints, up ad lib, voiding, tolerating PO and + flatus  Objective: Blood pressure 128/83, pulse 99, temperature 98.3 F (36.8 C), temperature source Oral, resp. rate 18, height 5\' 1"  (1.549 m), weight 165 lb (74.844 kg), last menstrual period 11/25/2013, SpO2 98 %, unknown if currently breastfeeding.  Physical Exam:  General: alert, cooperative, appears stated age and no distress Lochia: appropriate Uterine Fundus: firm Incision: n/a vaginal delivery DVT Evaluation: No evidence of DVT seen on physical exam. Negative Homan's sign. No cords or calf tenderness. No significant calf/ankle edema.   Recent Labs  07/29/14 1330 07/29/14 2210  HGB 10.9* 11.0*  HCT 33.5* 34.2*    Assessment/Plan: Plan for discharge tomorrow, Breastfeeding and Lactation consult   LOS: 2 days   Shirlee Latchngela Bacigalupo 07/31/2014, 7:30 AM

## 2014-08-01 ENCOUNTER — Ambulatory Visit: Payer: Self-pay

## 2014-08-01 MED ORDER — IBUPROFEN 600 MG PO TABS: 600.0000 mg | ORAL_TABLET | Freq: Four times a day (QID) | ORAL | Status: DC | PRN

## 2014-08-01 MED ORDER — SENNOSIDES-DOCUSATE SODIUM 8.6-50 MG PO TABS: 1.0000 | ORAL_TABLET | Freq: Every evening | ORAL | Status: DC | PRN

## 2014-08-01 NOTE — Lactation Note (Signed)
This note was copied from the chart of Girl Joneen CarawayZainab Skop. Lactation Consultation Note: assist mother with latching infant. Mother complaints of slight pinch. Adjusted infants lower jaw for wider gape.  Observed sustained latch for 10 mins with a few swallows. Mother taught breast compression . Mother given a hand pump and advised post pumping each breast for 15 mins.  Encouraged mother to breastfeed before supplementing. Father states that they do have insurance. Advised to phone tomorrow for an electric pump. Discussed the 2 week rental with parents. Staff nurse gave mother comfort gels. She does have a positional strip on the left nipple. Reviewed baby and me book on proper latch.   Patient Name: Girl Joneen CarawayZainab Castner ONGEX'BToday's Date: 08/01/2014 Reason for consult: Follow-up assessment   Maternal Data    Feeding Feeding Type: Formula Length of feed: 20 min  LATCH Score/Interventions Latch: Grasps breast easily, tongue down, lips flanged, rhythmical sucking.  Audible Swallowing: A few with stimulation  Type of Nipple: Everted at rest and after stimulation  Comfort (Breast/Nipple): Filling, red/small blisters or bruises, mild/mod discomfort  Problem noted: Filling;Mild/Moderate discomfort  Hold (Positioning): Assistance needed to correctly position infant at breast and maintain latch. Intervention(s): Support Pillows;Position options  LATCH Score: 7  Lactation Tools Discussed/Used     Consult Status Consult Status: Follow-up Date: 08/01/14 Follow-up type: In-patient    Stevan BornKendrick, Rodrigues [facility] 08/01/2014, 3:56 PM

## 2014-08-01 NOTE — Discharge Instructions (Signed)

## 2014-08-01 NOTE — Progress Notes (Signed)
UR chart review completed.  

## 2014-08-01 NOTE — Discharge Summary (Signed)
Obstetric Discharge Summary Reason for Admission: onset of labor Prenatal Procedures: none Intrapartum Procedures: spontaneous vaginal delivery Postpartum Procedures: none Complications-Operative and Postpartum: 2nd degree perineal laceration  Hospital Course:  Active Problems:   Active labor   Preeclampsia   Krystal Wood is a 19 y.o. G1P1001 s/p SVD.  Patient presented to MAU with painful contractions q2529m and was admitted to L&D.  She had a postpartum course that was uncomplicated including no problems with ambulating, PO intake, urination, pain, or bleeding. The pt feels ready to go home and  will be discharged with outpatient follow-up.   Today: No acute events overnight.  Pt denies problems with ambulating, voiding or po intake.  She denies nausea or vomiting.  Pain is well controlled.  She has had flatus. She has had bowel movement.  Lochia Small.  Plan for birth control is  natural family planning (NFP).  Method of Feeding: Breast   H/H: Lab Results  Component Value Date/Time   HGB 11.0* 07/29/2014 10:10 PM   HGB 10.3* 04/14/2014 10:18 AM   HCT 34.2* 07/29/2014 10:10 PM    Discharge Diagnoses: Term Pregnancy-delivered and Preelampsia  Discharge Information: Date: 08/01/2014 Activity: pelvic rest Diet: routine  Medications: PNV, Ibuprofen and Colace Breast feeding:  Yes Condition: stable Instructions: refer to handout Discharge to: home      Medication List    TAKE these medications        ibuprofen 600 MG tablet  Commonly known as:  ADVIL,MOTRIN  Take 1 tablet (600 mg total) by mouth every 6 (six) hours as needed.     senna-docusate 8.6-50 MG per tablet  Commonly known as:  Senokot-S  Take 1 tablet by mouth at bedtime as needed for mild constipation.       Follow-up Information    Follow up with Shirlee LatchAngela Bacigalupo, MD. Schedule an appointment as soon as possible for a visit in 6 weeks.   Specialty:  Family Medicine   Why:  For post-partum visit   Contact information:   [address] [place] [address] [phone]       Shirlee LatchAngela Bacigalupo ,MD MPH PGY-1 [facility]  08/01/2014,7:41 AM

## 2014-09-01 ENCOUNTER — Ambulatory Visit: Payer: Self-pay | Attending: Internal Medicine

## 2014-09-06 ENCOUNTER — Ambulatory Visit: Payer: Self-pay

## 2014-09-09 ENCOUNTER — Ambulatory Visit: Payer: Self-pay

## 2014-09-26 ENCOUNTER — Ambulatory Visit: Payer: Self-pay | Admitting: Internal Medicine

## 2014-12-09 ENCOUNTER — Ambulatory Visit: Payer: Medicaid Other

## 2015-09-18 ENCOUNTER — Ambulatory Visit (INDEPENDENT_AMBULATORY_CARE_PROVIDER_SITE_OTHER): Payer: Medicaid Other | Admitting: Internal Medicine

## 2015-09-18 ENCOUNTER — Encounter: Payer: Self-pay | Admitting: Obstetrics and Gynecology

## 2015-09-18 VITALS — BP 112/60 | HR 96 | Temp 98.1°F | Ht 61.0 in | Wt 132.0 lb

## 2015-09-18 DIAGNOSIS — N912 Amenorrhea, unspecified: Secondary | ICD-10-CM | POA: Diagnosis not present

## 2015-09-18 LAB — POCT URINE PREGNANCY: PREG TEST UR: POSITIVE — AB

## 2015-09-18 MED ORDER — PRENATAL MULTIVITAMIN CH: 1.0000 | ORAL_TABLET | Freq: Every day | ORAL | Status: DC

## 2015-09-18 MED ORDER — PRENATAL MULTIVITAMIN CH: 1.0000 | ORAL_TABLET | Freq: Every day | ORAL | 12 refills | Status: AC

## 2015-09-18 NOTE — Progress Notes (Signed)
   [facility] Krystal CharsAsiyah Mikell, MD Phone: [phone]  Reason For Visit: SDA,  pregnancy test  # Patient with amenorrhea for two months. Indicates that she is sexual active. Does not use any form of birth control. Denies any issues with nausea or vomiting. States that sometimes she has back pain, however this has been ongoing intermittently since her last daughters birth. Otherwise patient denies any complaints   Past Medical History Reviewed problem list.  Medications- reviewed and updated No additions to family history  Objective: BP 112/60   Pulse 96   Temp 98.1 F (36.7 C) (Oral)   Ht 5\' 1"  (1.549 m)   Wt 132 lb (59.9 kg)   LMP 07/16/2015   SpO2 98%   BMI 24.94 kg/m  Gen: NAD, alert, cooperative with exam Skin: dry, intact, no rashes or lesions Neuro: Strength and sensation grossly intact  Assessment/Plan: See problem based a/p   Amenorrhea Positive pregnancy test, no issues  - Follow up for initial OB prenatal labs and visit

## 2015-09-18 NOTE — Patient Instructions (Signed)
Please start taking prenatal vitamins. Can take tylenol as needed for back pain. You will need an OB visit for follow up

## 2015-09-20 DIAGNOSIS — N912 Amenorrhea, unspecified: Secondary | ICD-10-CM | POA: Insufficient documentation

## 2015-09-20 NOTE — Assessment & Plan Note (Addendum)
Positive pregnancy test, no issues  - Follow up for initial OB prenatal labs and visit  - Start prenatal vitamins

## 2015-10-25 ENCOUNTER — Other Ambulatory Visit: Payer: Medicaid Other

## 2015-10-27 ENCOUNTER — Other Ambulatory Visit: Payer: Medicaid Other

## 2015-11-01 ENCOUNTER — Encounter: Payer: Self-pay | Admitting: Family Medicine

## 2015-11-01 ENCOUNTER — Ambulatory Visit (INDEPENDENT_AMBULATORY_CARE_PROVIDER_SITE_OTHER): Payer: Medicaid Other | Admitting: Family Medicine

## 2015-11-01 ENCOUNTER — Other Ambulatory Visit (HOSPITAL_COMMUNITY)
Admission: RE | Admit: 2015-11-01 | Discharge: 2015-11-01 | Disposition: A | Discharge status: Home / Self Care | Payer: Medicaid Other | Source: Ambulatory Visit | Attending: Family Medicine | Admitting: Family Medicine

## 2015-11-01 ENCOUNTER — Ambulatory Visit: Payer: Medicaid Other | Admitting: Family Medicine

## 2015-11-01 VITALS — BP 117/70 | HR 108 | Temp 98.2°F | Wt 132.2 lb

## 2015-11-01 DIAGNOSIS — Z113 Encounter for screening for infections with a predominantly sexual mode of transmission: Secondary | ICD-10-CM | POA: Insufficient documentation

## 2015-11-01 DIAGNOSIS — Z3482 Encounter for supervision of other normal pregnancy, second trimester: Secondary | ICD-10-CM | POA: Diagnosis not present

## 2015-11-01 DIAGNOSIS — O09299 Supervision of pregnancy with other poor reproductive or obstetric history, unspecified trimester: Secondary | ICD-10-CM

## 2015-11-01 DIAGNOSIS — Z23 Encounter for immunization: Secondary | ICD-10-CM

## 2015-11-01 NOTE — Progress Notes (Addendum)
Krystal Wood is a 20 y.o. yo G2P1001 at 7019w3d who presents forJoneen Wood her initial prenatal visit. Pregnancy is planned She reports round ligament pain She  is not taking PNV. See flow sheet for details.  PMH, POBH, FH, meds, allergies and Social Hx reviewed.  Prenatal Exam: Gen: Well nourished, well developed.  No distress.  Vitals noted. HEENT: Normocephalic, atraumatic.  Neck supple without cervical lymphadenopathy, thyromegaly or thyroid nodules.  Fair dentition. CV: RRR no murmur, gallops or rubs Lungs: CTAB.  Normal respiratory effort without wheezes or rales. Abd: soft, NTND. +BS.  GU: Normal external female genitalia without lesions.  Normal vaginal, well rugated without lesions. No vaginal discharge.  Bimanual exam: No adnexal mass or TTP. No CMT.  Uterus size below umbilicus Ext: No clubbing, cyanosis or edema. Psych: Normal grooming and dress.  Not depressed or anxious appearing.  Normal thought content and process without flight of ideas or looseness of associations.  Assessment & Plan: 1) 20 y.o. yo G2P1001 at 9119w3d via LMP doing well.  Current pregnancy issues include none - reassured about round ligament pain. Dating is reliable. Prenatal labs - to be collected today Genetic screening offered: declined. Early glucola is not indicated.  PHQ-9 and Pregnancy Medical Home forms completed and reviewed. Schedule Anatomy scan GC/CT collected UTD on pap  2. H/o preeclampsia with previous pregnancy - monitor BP closely throughout pregnancy - refer to high risk OB is BP elevates  Bleeding and pain precautions reviewed. Importance of prenatal vitamins reviewed.  Follow up in 4 weeks.  Erasmo DownerAngela M Renaud Celli, MD, MPH PGY-3,  [facility] 11/01/2015 2:59 PM

## 2015-11-01 NOTE — Assessment & Plan Note (Signed)
Monitor BP closely

## 2015-11-01 NOTE — Patient Instructions (Addendum)
Nice to see you.  Take prenatal vitamins daily.  We will get labs today.  Someone will call or send a letter with results.  Come back in 4 weeks.  Go to ultrasound in the meantime.  Take care, Dr. Leonard Schwartz  Second Trimester of Pregnancy The second trimester is from week 13 through week 28, months 4 through 6. The second trimester is often a time when you feel your best. Your body has also adjusted to being pregnant, and you begin to feel better physically. Usually, morning sickness has lessened or quit completely, you may have more energy, and you may have an increase in appetite. The second trimester is also a time when the fetus is growing rapidly. At the end of the sixth month, the fetus is about 9 inches long and weighs about 1 pounds. You will likely begin to feel the baby move (quickening) between 18 and 20 weeks of the pregnancy. BODY CHANGES Your body goes through many changes during pregnancy. The changes vary from woman to woman.   Your weight will continue to increase. You will notice your lower abdomen bulging out.  You may begin to get stretch marks on your hips, abdomen, and breasts.  You may develop headaches that can be relieved by medicines approved by your health care provider.  You may urinate more often because the fetus is pressing on your bladder.  You may develop or continue to have heartburn as a result of your pregnancy.  You may develop constipation because certain hormones are causing the muscles that push waste through your intestines to slow down.  You may develop hemorrhoids or swollen, bulging veins (varicose veins).  You may have back pain because of the weight gain and pregnancy hormones relaxing your joints between the bones in your pelvis and as a result of a shift in weight and the muscles that support your balance.  Your breasts will continue to grow and be tender.  Your gums may bleed and may be sensitive to brushing and flossing.  Dark spots or  blotches (chloasma, mask of pregnancy) may develop on your face. This will likely fade after the baby is born.  A dark line from your belly button to the pubic area (linea nigra) may appear. This will likely fade after the baby is born.  You may have changes in your hair. These can include thickening of your hair, rapid growth, and changes in texture. Some women also have hair loss during or after pregnancy, or hair that feels dry or thin. Your hair will most likely return to normal after your baby is born. WHAT TO EXPECT AT YOUR PRENATAL VISITS During a routine prenatal visit:  You will be weighed to make sure you and the fetus are growing normally.  Your blood pressure will be taken.  Your abdomen will be measured to track your baby's growth.  The fetal heartbeat will be listened to.  Any test results from the previous visit will be discussed. Your health care provider may ask you:  How you are feeling.  If you are feeling the baby move.  If you have had any abnormal symptoms, such as leaking fluid, bleeding, severe headaches, or abdominal cramping.  If you are using any tobacco products, including cigarettes, chewing tobacco, and electronic cigarettes.  If you have any questions. Other tests that may be performed during your second trimester include:  Blood tests that check for:  Low iron levels (anemia).  Gestational diabetes (between 24 and 28 weeks).  Rh antibodies.  Urine tests to check for infections, diabetes, or protein in the urine.  An ultrasound to confirm the proper growth and development of the baby.  An amniocentesis to check for possible genetic problems.  Fetal screens for spina bifida and Down syndrome.  HIV (human immunodeficiency virus) testing. Routine prenatal testing includes screening for HIV, unless you choose not to have this test. HOME CARE INSTRUCTIONS   Avoid all smoking, herbs, alcohol, and unprescribed drugs. These chemicals affect the  formation and growth of the baby.  Do not use any tobacco products, including cigarettes, chewing tobacco, and electronic cigarettes. If you need help quitting, ask your health care provider. You may receive counseling support and other resources to help you quit.  Follow your health care provider's instructions regarding medicine use. There are medicines that are either safe or unsafe to take during pregnancy.  Exercise only as directed by your health care provider. Experiencing uterine cramps is a good sign to stop exercising.  Continue to eat regular, healthy meals.  Wear a good support bra for breast tenderness.  Do not use hot tubs, steam rooms, or saunas.  Wear your seat belt at all times when driving.  Avoid raw meat, uncooked cheese, cat litter boxes, and soil used by cats. These carry germs that can cause birth defects in the baby.  Take your prenatal vitamins.  Take 1500-2000 mg of calcium daily starting at the 20th week of pregnancy until you deliver your baby.  Try taking a stool softener (if your health care provider approves) if you develop constipation. Eat more high-fiber foods, such as fresh vegetables or fruit and whole grains. Drink plenty of fluids to keep your urine clear or pale yellow.  Take warm sitz baths to soothe any pain or discomfort caused by hemorrhoids. Use hemorrhoid cream if your health care provider approves.  If you develop varicose veins, wear support hose. Elevate your feet for 15 minutes, 3-4 times a day. Limit salt in your diet.  Avoid heavy lifting, wear low heel shoes, and practice good posture.  Rest with your legs elevated if you have leg cramps or low back pain.  Visit your dentist if you have not gone yet during your pregnancy. Use a soft toothbrush to brush your teeth and be gentle when you floss.  A sexual relationship may be continued unless your health care provider directs you otherwise.  Continue to go to all your prenatal visits  as directed by your health care provider. SEEK MEDICAL CARE IF:   You have dizziness.  You have mild pelvic cramps, pelvic pressure, or nagging pain in the abdominal area.  You have persistent nausea, vomiting, or diarrhea.  You have a bad smelling vaginal discharge.  You have pain with urination. SEEK IMMEDIATE MEDICAL CARE IF:   You have a fever.  You are leaking fluid from your vagina.  You have spotting or bleeding from your vagina.  You have severe abdominal cramping or pain.  You have rapid weight gain or loss.  You have shortness of breath with chest pain.  You notice sudden or extreme swelling of your face, hands, ankles, feet, or legs.  You have not felt your baby move in over an hour.  You have severe headaches that do not go away with medicine.  You have vision changes.   This information is not intended to replace advice given to you by your health care provider. Make sure you discuss any questions you have with your  health care provider.   Document Released: 01/08/2001 Document Revised: 02/04/2014 Document Reviewed: 03/17/2012 Elsevier Interactive Patient Education [employer]

## 2015-11-01 NOTE — Addendum Note (Signed)
Addended by: Lamonte SakaiZIMMERMAN RUMPLE, [place] Delio D on: 11/01/2015 03:34 PM   Modules accepted: Orders

## 2015-11-02 LAB — OBSTETRIC PANEL
ANTIBODY SCREEN: NEGATIVE
Basophils Absolute: 0 cells/uL (ref 0–200)
Basophils Relative: 0 %
EOS PCT: 4 %
Eosinophils Absolute: 320 cells/uL (ref 15–500)
HEMATOCRIT: 32.4 % — AB (ref 35.0–45.0)
HEMOGLOBIN: 11 g/dL — AB (ref 11.7–15.5)
Hepatitis B Surface Ag: NEGATIVE
LYMPHS PCT: 19 %
Lymphs Abs: 1520 cells/uL (ref 850–3900)
MCH: 29.2 pg (ref 27.0–33.0)
MCHC: 34 g/dL (ref 32.0–36.0)
MCV: 85.9 fL (ref 80.0–100.0)
MPV: 11.5 fL (ref 7.5–12.5)
Monocytes Absolute: 400 cells/uL (ref 200–950)
Monocytes Relative: 5 %
Neutro Abs: 5760 cells/uL (ref 1500–7800)
Neutrophils Relative %: 72 %
Platelets: 228 10*3/uL (ref 140–400)
RBC: 3.77 MIL/uL — ABNORMAL LOW (ref 3.80–5.10)
RDW: 13.5 % (ref 11.0–15.0)
Rh Type: POSITIVE
Rubella: 3.87 Index — ABNORMAL HIGH (ref <0.90)
WBC: 8 10*3/uL (ref 3.8–10.8)

## 2015-11-02 LAB — SICKLE CELL SCREEN: Sickle Cell Screen: NEGATIVE

## 2015-11-02 LAB — CERVICOVAGINAL ANCILLARY ONLY
Chlamydia: NEGATIVE
NEISSERIA GONORRHEA: NEGATIVE

## 2015-11-02 LAB — HIV ANTIBODY (ROUTINE TESTING W REFLEX): HIV: NONREACTIVE

## 2015-11-03 LAB — CULTURE, OB URINE

## 2015-11-08 ENCOUNTER — Telehealth: Payer: Self-pay | Admitting: Family Medicine

## 2015-11-08 DIAGNOSIS — O9989 Other specified diseases and conditions complicating pregnancy, childbirth and the puerperium: Principal | ICD-10-CM

## 2015-11-08 DIAGNOSIS — R8271 Bacteriuria: Secondary | ICD-10-CM | POA: Insufficient documentation

## 2015-11-08 DIAGNOSIS — O99891 Other specified diseases and conditions complicating pregnancy: Secondary | ICD-10-CM

## 2015-11-08 MED ORDER — CEPHALEXIN 500 MG PO CAPS: 500.0000 mg | ORAL_CAPSULE | Freq: Two times a day (BID) | ORAL | 0 refills | Status: DC

## 2015-11-08 NOTE — Telephone Encounter (Signed)
Labs normal with exception of UTI.  During pregnancy, it is important to treat bacteruria even if asymptomatic.  Will send Rx for Keflex x7 days.  Needs lab appt for urine culture next Friday to ensure sterilization.  Attempted to discuss with patient.  No answer. Left VM asking her to call back.  Please relay above to her if she returns call and attempt to call again this afternoon if no return call. Thanks!  Erasmo DownerAngela M Bacigalupo, MD, MPH PGY-3,  [facility] 11/08/2015 8:34 AM

## 2015-11-08 NOTE — Telephone Encounter (Signed)
Patient husband informed, expressed understanding. 

## 2015-11-10 ENCOUNTER — Ambulatory Visit (INDEPENDENT_AMBULATORY_CARE_PROVIDER_SITE_OTHER): Payer: Medicaid Other | Admitting: Internal Medicine

## 2015-11-10 ENCOUNTER — Encounter: Payer: Self-pay | Admitting: Internal Medicine

## 2015-11-10 VITALS — BP 122/64 | HR 114 | Temp 98.0°F | Ht <= 58 in | Wt 131.8 lb

## 2015-11-10 DIAGNOSIS — O9989 Other specified diseases and conditions complicating pregnancy, childbirth and the puerperium: Secondary | ICD-10-CM

## 2015-11-10 DIAGNOSIS — N3000 Acute cystitis without hematuria: Secondary | ICD-10-CM

## 2015-11-10 DIAGNOSIS — R8271 Bacteriuria: Secondary | ICD-10-CM | POA: Diagnosis not present

## 2015-11-10 NOTE — Assessment & Plan Note (Signed)
Instructed patient to pick up Keflex previously prescribed by Dr. Beryle FlockBacigalupo today and begin treatment course as planned. Patient also to schedule lab appointment for next Friday to provide urine sample for test of cure. Patient voiced understanding.

## 2015-11-10 NOTE — Progress Notes (Signed)
   Subjective:    Patient ID: Krystal CarawayZainab Wood, female    DOB: [birth year], 20 y.o.   MRN: [id number]  HPI  Patient presents for same day appointment for UTI f/u.   UTI Patient diagnosed with asymptomatic bacteruria on 10/11 by Dr. Beryle FlockBacigalupo. Keflex 500mg  BID x7d sent to pharmacy by Dr. Beryle FlockBacigalupo and patient told to return for test of cure next Friday (Oct 20). Patient misunderstood and thought she was supposed to return for a yeast infection f/u today. She has not picked up the prescription. Now reporting some dysuria but only occasionally. Denies vaginal discharge other than her usual minimal discharge. Denies vaginal bleeding or hematuria.   Review of Systems See HPI.     Objective:   Physical Exam  Constitutional: She is oriented to person, place, and time. She appears well-developed and well-nourished.  HENT:  Head: Normocephalic and atraumatic.  Eyes: EOM are normal. Right eye exhibits no discharge. Left eye exhibits no discharge.  Pulmonary/Chest: Effort normal. No respiratory distress.  Neurological: She is alert and oriented to person, place, and time.  Psychiatric: She has a normal mood and affect. Her behavior is normal.      Assessment & Plan:  Asymptomatic bacteriuria during pregnancy in second trimester Instructed patient to pick up Keflex previously prescribed by Dr. Beryle FlockBacigalupo today and begin treatment course as planned. Patient also to schedule lab appointment for next Friday to provide urine sample for test of cure. Patient voiced understanding.   Tarri AbernethyAbigail J Lancaster, MD, MPH PGY-2 [facility] Pager [phone]

## 2015-11-10 NOTE — Patient Instructions (Addendum)
It was nice meeting you today Krystal Wood!  For your urinary tract infection, please pick up your antibiotic (Keflex) from your pharmacy [facility] [place] today. You will take one tablet two times a day for the next week. Take the first tablet today when you pick up the prescription.   You will need to come back for a lab appointment next Friday (October 20) to give a urine sample to make sure your urinary tract infection is cured.   If you have any questions or concerns, please feel free to call the clinic.   Be well,  Dr. Natale MilchLancaster

## 2015-11-15 ENCOUNTER — Encounter (HOSPITAL_COMMUNITY): Payer: Self-pay | Admitting: Family Medicine

## 2015-11-17 ENCOUNTER — Other Ambulatory Visit: Payer: Medicaid Other

## 2015-11-20 ENCOUNTER — Telehealth: Payer: Self-pay | Admitting: Family Medicine

## 2015-11-20 NOTE — Telephone Encounter (Signed)
FYI to PCP

## 2015-11-20 NOTE — Telephone Encounter (Signed)
Her new drs information:  [facility]  [address] [place].  [phone].  Explained to pt how to request medical records

## 2015-11-27 ENCOUNTER — Ambulatory Visit (HOSPITAL_COMMUNITY): Payer: Medicaid Other

## 2015-11-29 ENCOUNTER — Encounter: Payer: Medicaid Other | Admitting: Family Medicine

## 2015-12-01 ENCOUNTER — Ambulatory Visit (HOSPITAL_COMMUNITY): Payer: Medicaid Other

## 2015-12-05 ENCOUNTER — Inpatient Hospital Stay (HOSPITAL_COMMUNITY)
Admission: AD | Admit: 2015-12-05 | Discharge: 2015-12-05 | Disposition: A | Discharge status: Home / Self Care | Payer: Medicaid Other | Source: Ambulatory Visit | Attending: Family Medicine | Admitting: Family Medicine

## 2015-12-05 DIAGNOSIS — Z3A20 20 weeks gestation of pregnancy: Secondary | ICD-10-CM | POA: Diagnosis not present

## 2015-12-05 DIAGNOSIS — Z3492 Encounter for supervision of normal pregnancy, unspecified, second trimester: Secondary | ICD-10-CM | POA: Insufficient documentation

## 2015-12-05 DIAGNOSIS — O9A312 Physical abuse complicating pregnancy, second trimester: Secondary | ICD-10-CM | POA: Diagnosis not present

## 2015-12-05 DIAGNOSIS — IMO0002 Reserved for concepts with insufficient information to code with codable children: Secondary | ICD-10-CM

## 2015-12-05 NOTE — MAU Note (Signed)
Baby is not moving, has not felt it move. Denies pain.  Domestic "things going on", wanting to get checked out, make sure baby is ok.. 1cm lac on top of head, dried blood noted, edges approximated.

## 2015-12-05 NOTE — Discharge Instructions (Signed)
Second Trimester of Pregnancy The second trimester is from week 13 through week 28, months 4 through 6. The second trimester is often a time when you feel your best. Your body has also adjusted to being pregnant, and you begin to feel better physically. Usually, morning sickness has lessened or quit completely, you may have more energy, and you may have an increase in appetite. The second trimester is also a time when the fetus is growing rapidly. At the end of the sixth month, the fetus is about 9 inches long and weighs about 1 pounds. You will likely begin to feel the baby move (quickening) between 18 and 20 weeks of the pregnancy. BODY CHANGES Your body goes through many changes during pregnancy. The changes vary from woman to woman.   Your weight will continue to increase. You will notice your lower abdomen bulging out.  You may begin to get stretch marks on your hips, abdomen, and breasts.  You may develop headaches that can be relieved by medicines approved by your health care provider.  You may urinate more often because the fetus is pressing on your bladder.  You may develop or continue to have heartburn as a result of your pregnancy.  You may develop constipation because certain hormones are causing the muscles that push waste through your intestines to slow down.  You may develop hemorrhoids or swollen, bulging veins (varicose veins).  You may have back pain because of the weight gain and pregnancy hormones relaxing your joints between the bones in your pelvis and as a result of a shift in weight and the muscles that support your balance.  Your breasts will continue to grow and be tender.  Your gums may bleed and may be sensitive to brushing and flossing.  Dark spots or blotches (chloasma, mask of pregnancy) may develop on your face. This will likely fade after the baby is born.  A dark line from your belly button to the pubic area (linea nigra) may appear. This will likely fade  after the baby is born.  You may have changes in your hair. These can include thickening of your hair, rapid growth, and changes in texture. Some women also have hair loss during or after pregnancy, or hair that feels dry or thin. Your hair will most likely return to normal after your baby is born. WHAT TO EXPECT AT YOUR PRENATAL VISITS During a routine prenatal visit:  You will be weighed to make sure you and the fetus are growing normally.  Your blood pressure will be taken.  Your abdomen will be measured to track your baby's growth.  The fetal heartbeat will be listened to.  Any test results from the previous visit will be discussed. Your health care provider may ask you:  How you are feeling.  If you are feeling the baby move.  If you have had any abnormal symptoms, such as leaking fluid, bleeding, severe headaches, or abdominal cramping.  If you are using any tobacco products, including cigarettes, chewing tobacco, and electronic cigarettes.  If you have any questions. Other tests that may be performed during your second trimester include:  Blood tests that check for:  Low iron levels (anemia).  Gestational diabetes (between 24 and 28 weeks).  Rh antibodies.  Urine tests to check for infections, diabetes, or protein in the urine.  An ultrasound to confirm the proper growth and development of the baby.  An amniocentesis to check for possible genetic problems.  Fetal screens for spina bifida  and Down syndrome.  HIV (human immunodeficiency virus) testing. Routine prenatal testing includes screening for HIV, unless you choose not to have this test. HOME CARE INSTRUCTIONS   Avoid all smoking, herbs, alcohol, and unprescribed drugs. These chemicals affect the formation and growth of the baby.  Do not use any tobacco products, including cigarettes, chewing tobacco, and electronic cigarettes. If you need help quitting, ask your health care provider. You may receive  counseling support and other resources to help you quit.  Follow your health care provider's instructions regarding medicine use. There are medicines that are either safe or unsafe to take during pregnancy.  Exercise only as directed by your health care provider. Experiencing uterine cramps is a good sign to stop exercising.  Continue to eat regular, healthy meals.  Wear a good support bra for breast tenderness.  Do not use hot tubs, steam rooms, or saunas.  Wear your seat belt at all times when driving.  Avoid raw meat, uncooked cheese, cat litter boxes, and soil used by cats. These carry germs that can cause birth defects in the baby.  Take your prenatal vitamins.  Take 1500-2000 mg of calcium daily starting at the 20th week of pregnancy until you deliver your baby.  Try taking a stool softener (if your health care provider approves) if you develop constipation. Eat more high-fiber foods, such as fresh vegetables or fruit and whole grains. Drink plenty of fluids to keep your urine clear or pale yellow.  Take warm sitz baths to soothe any pain or discomfort caused by hemorrhoids. Use hemorrhoid cream if your health care provider approves.  If you develop varicose veins, wear support hose. Elevate your feet for 15 minutes, 3-4 times a day. Limit salt in your diet.  Avoid heavy lifting, wear low heel shoes, and practice good posture.  Rest with your legs elevated if you have leg cramps or low back pain.  Visit your dentist if you have not gone yet during your pregnancy. Use a soft toothbrush to brush your teeth and be gentle when you floss.  A sexual relationship may be continued unless your health care provider directs you otherwise.  Continue to go to all your prenatal visits as directed by your health care provider. SEEK MEDICAL CARE IF:   You have dizziness.  You have mild pelvic cramps, pelvic pressure, or nagging pain in the abdominal area.  You have persistent nausea,  vomiting, or diarrhea.  You have a bad smelling vaginal discharge.  You have pain with urination. SEEK IMMEDIATE MEDICAL CARE IF:   You have a fever.  You are leaking fluid from your vagina.  You have spotting or bleeding from your vagina.  You have severe abdominal cramping or pain.  You have rapid weight gain or loss.  You have shortness of breath with chest pain.  You notice sudden or extreme swelling of your face, hands, ankles, feet, or legs.  You have not felt your baby move in over an hour.  You have severe headaches that do not go away with medicine.  You have vision changes.   This information is not intended to replace advice given to you by your health care provider. Make sure you discuss any questions you have with your health care provider.   Document Released: 01/08/2001 Document Revised: 02/04/2014 Document Reviewed: 03/17/2012 Elsevier Interactive Patient Education 2016 [employer] Domestic Violence Information WHAT IS DOMESTIC VIOLENCE?  Domestic violence, also called intimate partner violence, can involve physical, emotional, psychological, sexual, and  economic abuse by a current or former intimate partner. Stalking is also considered a type of domestic violence. Domestic violence can happen between people who are or were:   Married.  Dating.  Living together. Abusers repeatedly act to maintain control and power over their partner. Physical abuse can include:  Slapping.   Hitting.   Kicking.   Punching.  Choking.  Pulling the victim's hair.  Damaging the victim's property.  Threatening or hurting the victim with weapons.  Trapping the victim in his or her home.  Forcing the victim to use drugs or alcohol. Emotional and psychological abuse can include:  Threats.  Insults.   Isolation.   Humiliation.  Jealousy and possessiveness.  Blame.  Withholding affection.   Intimidation.   Manipulation.   Limiting  contact with friends and family.  Sexual abuse can include:  Forcing sex.   Forcing sexual touching.   Hurting the victim during sex.  Forcing the victim to have sex with other people.  Giving the victim a sexually transmitted disease (STD) on purpose. Economic abuse can include:  Controlling resources, such as money, food, transportation, a phone, or computer.  Stealing money from the victim or his or her family or friends.  Forbidding the victim to work.  Refusing to work or to contribute to the household. Stalking can include:  Making repeated, unwanted phone calls, emails, or text messages.  Leaving cards, letters, flowers or other items the victim does not want.  Watching or following the victim from a distance.  Going to places where the victim does not want the abuser.  Entering the victim's home or car.  Damaging the victim's personal property. WHAT ARE SOME WARNING SIGNS OF DOMESTIC VIOLENCE?  Physical signs  Bruises.   Broken bones.   Burns or cuts.   Physical pain.   Head injury. Emotional and psychological signs  Crying.   Depression.   Hopelessness.   Desperation.   Trouble sleeping.   Fear of partner.   Anxiety.  Suicidal behavior.  Antisocial behavior.  Low self-esteem.  Fear of intimacy.  Flashbacks. Sexual signs  Bruising, swelling, or bleeding of the genital or rectal area.   Signs of a sexually transmitted infection, such as genital sores, warts, or discharge coming from the genital area.   Pain in the genital area.   Unintended pregnancy.  Problems with pregnancy, including delayed prenatal care and prematurity. Economic signs  Having little money or food.   Homelessness.  Asking for or borrowing money. WHAT ARE COMMON BEHAVIORS OF THOSE AFFECTED BY DOMESTIC VIOLENCE? Those affected by domestic violence may:   Be late to work or other events.   Not show up to places as promised.    Have to let their partner know where they are and who they are with.   Be isolated or kept from seeing friends or family.   Make comments about their partner's temper or behavior.   Make excuses for their partner.   Engage in high-risk sexual behaviors.  Use drugs or alcohol.  Have unhealthy diet-related behaviors. WHAT ARE COMMON FEELINGS OF THOSE AFFECTED BY DOMESTIC VIOLENCE?  Those affected by domestic violence may feel that:   They have to be careful not to say or do things that trigger their partner's anger.   They cannot do anything right.   They deserve to be treated badly.   They are overreacting to their partner's behavior or temper.   They cannot trust their own feelings.   They cannot  trust other people.   They are trapped.   Their partner would take away their children.   They are emotionally drained or numb.   Their life is in danger.   They might have to kill their partner to survive.  WHERE CAN YOU GET HELP?  Domestic violence hotlines and websites If you do not feel safe searching for help online at home, use a computer at [employer] to access [employer]. Call 911 if you are in immediate danger or need medical help.   [employer].  The 24-hour phone hotline is 256-075-5266 or 931 181 1995 (TTY).   The videophone is available Monday through Friday, 9 a.m. to 5 p.m. Call [phone].  The website is http://thehotline.org  The National Sexual Assault Hotline.  The 24-hour phone hotline is 567-802-7637.  You can access the online hotline at [employer] Shelters for victims of domestic violence  If you are a victim of domestic violence, there are resources to help you find a temporary place for you and your children to live (shelter). The specific address of these shelters is often not known to the public.  Police Report assaults, threats, and stalking to the  police.  Counselors and counseling centers People who have been victims of domestic violence can benefit from counseling. Counseling can help you cope with difficult emotions and empower you to plan for your future safety. The topics you discuss with a counselor are private and confidential. Children of domestic violence victims also might need counseling to manage stress and anxiety.  The court system You can work with a Clinical research associate or an advocate to get legal protection against an abuser. Protection includes restraining orders and private addresses. Crimes against you, such as assault, can also be prosecuted through the courts. Laws vary by state.   This information is not intended to replace advice given to you by your health care provider. Make sure you discuss any questions you have with your health care provider.   Document Released: 04/06/2003 Document Revised: 02/04/2014 Document Reviewed: 10/01/2013 Elsevier Interactive Patient Education [employer]
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
## 2016-01-29 NOTE — L&D Delivery Note (Signed)
Delivery Note Patient was admitted for IOL for gestational hypertension.  She was started on pitocin and progressed to complete without complication.  At 3:15 AM a viable female was delivered via Vaginal, Spontaneous Delivery (Presentation: LOA).  APGAR: 9, 10; weight pending.   Placenta status: spontaneous, intact.  Active management of 3rd stage. Cord: 3VC without complications.   Anesthesia: lidocaine Episiotomy: None Lacerations: 1st degree and right periurethral Suture Repair: 3.0 monocryl Est. Blood Loss (mL): 200  Mom to postpartum.  Baby to Couplet care / Skin to Skin.  Krystal MerlesJulia Wood 04/16/2016, 3:48 AM  OB FELLOW DELIVERY ATTESTATION  I was gloved and present for the delivery in its entirety, and I agree with the above resident's note.    Krystal MowElizabeth Cesario Weidinger, DO OB Fellow

## 2016-04-15 ENCOUNTER — Inpatient Hospital Stay (HOSPITAL_COMMUNITY)
Admission: AD | Admit: 2016-04-15 | Discharge: 2016-04-18 | DRG: 775 | Disposition: A | Discharge status: Home / Self Care | Payer: Medicaid Other | Source: Ambulatory Visit | Attending: Obstetrics and Gynecology | Admitting: Obstetrics and Gynecology

## 2016-04-15 ENCOUNTER — Encounter (HOSPITAL_COMMUNITY): Payer: Self-pay

## 2016-04-15 DIAGNOSIS — Z3A39 39 weeks gestation of pregnancy: Secondary | ICD-10-CM

## 2016-04-15 DIAGNOSIS — Z8249 Family history of ischemic heart disease and other diseases of the circulatory system: Secondary | ICD-10-CM | POA: Diagnosis not present

## 2016-04-15 DIAGNOSIS — Z3493 Encounter for supervision of normal pregnancy, unspecified, third trimester: Secondary | ICD-10-CM | POA: Diagnosis present

## 2016-04-15 DIAGNOSIS — O134 Gestational [pregnancy-induced] hypertension without significant proteinuria, complicating childbirth: Principal | ICD-10-CM | POA: Diagnosis present

## 2016-04-15 DIAGNOSIS — O4202 Full-term premature rupture of membranes, onset of labor within 24 hours of rupture: Secondary | ICD-10-CM | POA: Diagnosis present

## 2016-04-15 LAB — COMPREHENSIVE METABOLIC PANEL
ALT: 14 U/L (ref 14–54)
AST: 17 U/L (ref 15–41)
Albumin: 2.8 g/dL — ABNORMAL LOW (ref 3.5–5.0)
Alkaline Phosphatase: 136 U/L — ABNORMAL HIGH (ref 38–126)
Anion gap: 8 (ref 5–15)
BUN: 9 mg/dL (ref 6–20)
CHLORIDE: 106 mmol/L (ref 101–111)
CO2: 22 mmol/L (ref 22–32)
Calcium: 8.6 mg/dL — ABNORMAL LOW (ref 8.9–10.3)
Creatinine, Ser: 0.42 mg/dL — ABNORMAL LOW (ref 0.44–1.00)
GFR calc Af Amer: >60 mL/min (ref >60)
Glucose, Bld: 81 mg/dL (ref 65–99)
Potassium: 3.6 mmol/L (ref 3.5–5.1)
Sodium: 136 mmol/L (ref 135–145)
Total Bilirubin: 0.4 mg/dL (ref 0.3–1.2)
Total Protein: 6.9 g/dL (ref 6.5–8.1)

## 2016-04-15 LAB — CBC
HEMATOCRIT: 32.1 % — AB (ref 36.0–46.0)
Hemoglobin: 10.7 g/dL — ABNORMAL LOW (ref 12.0–15.0)
MCH: 29.6 pg (ref 26.0–34.0)
MCHC: 33.3 g/dL (ref 30.0–36.0)
MCV: 88.9 fL (ref 78.0–100.0)
Platelets: 155 10*3/uL (ref 150–400)
RBC: 3.61 MIL/uL — AB (ref 3.87–5.11)
RDW: 14.3 % (ref 11.5–15.5)
WBC: 10.2 10*3/uL (ref 4.0–10.5)

## 2016-04-15 LAB — PROTEIN / CREATININE RATIO, URINE
Creatinine, Urine: 14 mg/dL
Total Protein, Urine: <6 mg/dL

## 2016-04-15 MED ORDER — ACETAMINOPHEN 325 MG PO TABS: 650.0000 mg | ORAL_TABLET | ORAL | Status: DC | PRN

## 2016-04-15 MED ORDER — LIDOCAINE HCL (PF) 1 % IJ SOLN
30.0000 mL | INTRAMUSCULAR | Status: AC | PRN
  Administered 2016-04-16: 30 mL via SUBCUTANEOUS
  Filled 2016-04-15: qty 30

## 2016-04-15 MED ORDER — OXYCODONE-ACETAMINOPHEN 5-325 MG PO TABS
1.0000 | ORAL_TABLET | ORAL | Status: DC | PRN
  Administered 2016-04-16: 1 via ORAL
  Filled 2016-04-15: qty 1

## 2016-04-15 MED ORDER — OXYTOCIN 40 UNITS IN LACTATED RINGERS INFUSION - SIMPLE MED: 2.5000 [IU]/h | INTRAVENOUS | Status: DC

## 2016-04-15 MED ORDER — LACTATED RINGERS IV SOLN
INTRAVENOUS | Status: DC
  Administered 2016-04-15: via INTRAVENOUS

## 2016-04-15 MED ORDER — OXYTOCIN BOLUS FROM INFUSION
500.0000 mL | Freq: Once | INTRAVENOUS | Status: AC
  Administered 2016-04-16: 500 mL via INTRAVENOUS

## 2016-04-15 MED ORDER — LACTATED RINGERS IV SOLN: 500.0000 mL | INTRAVENOUS | Status: DC | PRN

## 2016-04-15 MED ORDER — OXYCODONE-ACETAMINOPHEN 5-325 MG PO TABS: 2.0000 | ORAL_TABLET | ORAL | Status: DC | PRN

## 2016-04-15 MED ORDER — TERBUTALINE SULFATE 1 MG/ML IJ SOLN: 0.2500 mg | Freq: Once | INTRAMUSCULAR | Status: DC | PRN

## 2016-04-15 MED ORDER — SOD CITRATE-CITRIC ACID 500-334 MG/5ML PO SOLN: 30.0000 mL | ORAL | Status: DC | PRN

## 2016-04-15 MED ORDER — OXYTOCIN 40 UNITS IN LACTATED RINGERS INFUSION - SIMPLE MED
1.0000 m[IU]/min | INTRAVENOUS | Status: DC
  Administered 2016-04-16: 2 m[IU]/min via INTRAVENOUS
  Filled 2016-04-15 (×2): qty 1000

## 2016-04-15 MED ORDER — FENTANYL CITRATE (PF) 100 MCG/2ML IJ SOLN: 100.0000 ug | INTRAMUSCULAR | Status: DC | PRN

## 2016-04-15 MED ORDER — ONDANSETRON HCL 4 MG/2ML IJ SOLN: 4.0000 mg | Freq: Four times a day (QID) | INTRAMUSCULAR | Status: DC | PRN

## 2016-04-15 NOTE — MAU Note (Signed)
Contractions started 1600.  States approximately 1 minute between contractions.  Reports good fetal movement.  No VB or LOF.  Cervix checked last time in office (3/15) and was 1 cm.

## 2016-04-15 NOTE — H&P (Signed)
LABOR AND DELIVERY ADMISSION HISTORY AND PHYSICAL NOTE  Krystal Wood is a 21 y.o. female G2P1001 with IUP at [redacted]w[redacted]d by LMP c/w 20 week [place] presenting for contractons.  She has made slow cervical change and is likely in latent labor.  However, blood pressures noted to be elevated for 2.5 hours.  She denies any symptoms from this.  She reports positive fetal movement. She denies leakage of fluid or vaginal bleeding.  Prenatal History/Complications:  Past Medical History: Past Medical History:  Diagnosis Date  . Preeclampsia 07/29/2014    Past Surgical History: Past Surgical History:  Procedure Laterality Date  . NO PAST SURGERIES      Obstetrical History: OB History    Gravida Para Term Preterm AB Living   2 1 1     1    SAB TAB Ectopic Multiple Live Births         0 1      Social History: Social History   Social History  . Marital status: Married    Spouse name: N/A  . Number of children: N/A  . Years of education: N/A   Social History Main Topics  . Smoking status: Never Smoker  . Smokeless tobacco: Never Used  . Alcohol use No  . Drug use: No  . Sexual activity: Yes   Other Topics Concern  . None   Social History Narrative  . None    Family History: Family History  Problem Relation Age of Onset  . Hypertension Mother     Allergies: No Known Allergies  Prescriptions Prior to Admission  Medication Sig Dispense Refill Last Dose  . Prenatal Vit-Fe Fumarate-FA (PRENATAL MULTIVITAMIN) TABS tablet Take 1 tablet by mouth daily at 12 noon. (Patient not taking: Reported on 04/15/2016) 30 tablet 12 Not Taking at Unknown time  . senna-docusate (SENOKOT-S) 8.6-50 MG per tablet Take 1 tablet by mouth at bedtime as needed for mild constipation. (Patient not taking: Reported on 04/15/2016) 30 tablet 0 Not Taking at Unknown time     Review of Systems   Review of Systems  Constitutional: Negative for fever.  HENT: Negative for congestion and sore throat.   Eyes:  Negative for blurred vision.  Respiratory: Negative for shortness of breath.   Cardiovascular: Negative for chest pain.  Gastrointestinal: Negative for abdominal pain.  Genitourinary: Negative for dysuria.  Neurological: Negative for headaches.   Blood pressure (!) 134/93, pulse 83, temperature 98 F (36.7 C), temperature source Oral, resp. rate 18, last menstrual period 07/16/2015, unknown if currently breastfeeding. General appearance: alert, cooperative and appears stated age Lungs: clear to auscultation bilaterally Heart: regular rate and rhythm Abdomen: soft, non-tender; gravid Extremities: trace edema b/l Presentation: cephalic Fetal monitoring: cat I - baseline 135, mod variability, +accels, no decels Uterine activity: q3 min Dilation: 3 Effacement (%): 70 Station: -3 Exam by:: Elenore Paddy, MD   Prenatal labs: ABO, Rh: O/POS/-- (10/04 1513) Antibody: NEG (10/04 1513) Rubella: immune RPR: NON REAC (10/04 1513)  HBsAg: NEGATIVE (10/04 1513)  HIV: NONREACTIVE (10/04 1513)  GBS:   neg 1 hr Glucola: 120 (wnl) Genetic screening:  declined Anatomy US: wnl, female  Prenatal Transfer Tool  Maternal Diabetes: No Genetic Screening: Declined Maternal Ultrasounds/Referrals: Normal Fetal Ultrasounds or other Referrals:  None Maternal Substance Abuse:  No Significant Maternal Medications:  None Significant Maternal Lab Results: Lab values include: Group B Strep negative  Results for orders placed or performed during the hospital encounter of 04/15/16 (from the past 24 hour(s))  Comprehensive metabolic  panel   Collection Time: 04/15/16 10:10 PM  Result Value Ref Range   Sodium 136 135 - 145 mmol/L   Potassium 3.6 3.5 - 5.1 mmol/L   Chloride 106 101 - 111 mmol/L   CO2 22 22 - 32 mmol/L   Glucose, Bld 81 65 - 99 mg/dL   BUN 9 6 - 20 mg/dL   Creatinine, Ser 8.110.42 (L) 0.44 - 1.00 mg/dL   Calcium 8.6 (L) 8.9 - 10.3 mg/dL   Total Protein 6.9 6.5 - 8.1 g/dL   Albumin 2.8 (L) 3.5 -  5.0 g/dL   AST 17 15 - 41 U/L   ALT 14 14 - 54 U/L   Alkaline Phosphatase 136 (H) 38 - 126 U/L   Total Bilirubin 0.4 0.3 - 1.2 mg/dL   GFR calc non Af Amer >60 >60 mL/min   GFR calc Af Amer >60 >60 mL/min   Anion gap 8 5 - 15  CBC   Collection Time: 04/15/16 10:10 PM  Result Value Ref Range   WBC 10.2 4.0 - 10.5 K/uL   RBC 3.61 (L) 3.87 - 5.11 MIL/uL   Hemoglobin 10.7 (L) 12.0 - 15.0 g/dL   HCT 91.432.1 (L) 78.236.0 - 95.646.0 %   MCV 88.9 78.0 - 100.0 fL   MCH 29.6 26.0 - 34.0 pg   MCHC 33.3 30.0 - 36.0 g/dL   RDW 21.314.3 08.611.5 - 57.815.5 %   Platelets 155 150 - 400 K/uL    Patient Active Problem List   Diagnosis Date Noted  . Asymptomatic bacteriuria during pregnancy in second trimester 11/08/2015  . Hx of preeclampsia, prior pregnancy, currently pregnant 07/29/2014  . Encounter for supervision of other normal pregnancy 06/02/2014    Assessment: Joneen CarawayZainab Shuttleworth is a 21 y.o. G2P1001 at [redacted]w[redacted]d here for augmentation of labor for gestational hypertension  #Labor: latent, plan for augmentation with pitocin #Pain: IV, nitrous, may want epidural #FWB: Category I #ID:  none #MOF: breast #MOC: IUD #Circ:  outpatient  Charlsie MerlesJulia Rhoden 04/15/2016, 10:58 PM   OB FELLOW HISTORY AND PHYSICAL ATTESTATION  I have seen and examined this patient; I agree with above documentation in the resident's note. Patient had persistently elevated DBP in 90s, admitting for new diagnosis of gestational hypertension, to be induced (augmented). PreE labs negative, symptoms negative.   Jen MowElizabeth Mumaw, DO OB Fellow 04/16/2016, 2:40 AM

## 2016-04-16 ENCOUNTER — Encounter (HOSPITAL_COMMUNITY): Payer: Self-pay

## 2016-04-16 ENCOUNTER — Encounter (HOSPITAL_COMMUNITY): Payer: Self-pay | Admitting: Anesthesiology

## 2016-04-16 DIAGNOSIS — Z3A39 39 weeks gestation of pregnancy: Secondary | ICD-10-CM

## 2016-04-16 DIAGNOSIS — O134 Gestational [pregnancy-induced] hypertension without significant proteinuria, complicating childbirth: Secondary | ICD-10-CM

## 2016-04-16 LAB — TYPE AND SCREEN
ABO/RH(D): O POS
ANTIBODY SCREEN: NEGATIVE

## 2016-04-16 MED ORDER — SIMETHICONE 80 MG PO CHEW
80.0000 mg | CHEWABLE_TABLET | ORAL | Status: DC | PRN
  Administered 2016-04-17: 80 mg via ORAL
  Filled 2016-04-16: qty 1

## 2016-04-16 MED ORDER — COCONUT OIL OIL: 1.0000 "application " | TOPICAL_OIL | Status: DC | PRN

## 2016-04-16 MED ORDER — ONDANSETRON HCL 4 MG PO TABS: 4.0000 mg | ORAL_TABLET | ORAL | Status: DC | PRN

## 2016-04-16 MED ORDER — DIBUCAINE 1 % RE OINT: 1.0000 "application " | TOPICAL_OINTMENT | RECTAL | Status: DC | PRN

## 2016-04-16 MED ORDER — WITCH HAZEL-GLYCERIN EX PADS: 1.0000 "application " | MEDICATED_PAD | CUTANEOUS | Status: DC | PRN

## 2016-04-16 MED ORDER — IBUPROFEN 600 MG PO TABS
600.0000 mg | ORAL_TABLET | Freq: Four times a day (QID) | ORAL | Status: DC
  Administered 2016-04-16 – 2016-04-18 (×9): 600 mg via ORAL
  Filled 2016-04-16 (×9): qty 1

## 2016-04-16 MED ORDER — TETANUS-DIPHTH-ACELL PERTUSSIS 5-2.5-18.5 LF-MCG/0.5 IM SUSP: 0.5000 mL | Freq: Once | INTRAMUSCULAR | Status: DC

## 2016-04-16 MED ORDER — ONDANSETRON HCL 4 MG/2ML IJ SOLN: 4.0000 mg | INTRAMUSCULAR | Status: DC | PRN

## 2016-04-16 MED ORDER — FENTANYL 2.5 MCG/ML BUPIVACAINE 1/10 % EPIDURAL INFUSION (WH - ANES)
INTRAMUSCULAR | Status: AC
  Filled 2016-04-16: qty 100

## 2016-04-16 MED ORDER — ZOLPIDEM TARTRATE 5 MG PO TABS: 5.0000 mg | ORAL_TABLET | Freq: Every evening | ORAL | Status: DC | PRN

## 2016-04-16 MED ORDER — DIPHENHYDRAMINE HCL 50 MG/ML IJ SOLN: 12.5000 mg | INTRAMUSCULAR | Status: DC | PRN

## 2016-04-16 MED ORDER — EPHEDRINE 5 MG/ML INJ: 10.0000 mg | INTRAVENOUS | Status: DC | PRN

## 2016-04-16 MED ORDER — PHENYLEPHRINE 40 MCG/ML (10ML) SYRINGE FOR IV PUSH (FOR BLOOD PRESSURE SUPPORT): 80.0000 ug | PREFILLED_SYRINGE | INTRAVENOUS | Status: DC | PRN

## 2016-04-16 MED ORDER — ACETAMINOPHEN 325 MG PO TABS
650.0000 mg | ORAL_TABLET | ORAL | Status: DC | PRN
  Administered 2016-04-17: 650 mg via ORAL
  Filled 2016-04-16: qty 2

## 2016-04-16 MED ORDER — BENZOCAINE-MENTHOL 20-0.5 % EX AERO
1.0000 "application " | INHALATION_SPRAY | CUTANEOUS | Status: DC | PRN
  Filled 2016-04-16: qty 56

## 2016-04-16 MED ORDER — PHENYLEPHRINE 40 MCG/ML (10ML) SYRINGE FOR IV PUSH (FOR BLOOD PRESSURE SUPPORT)
80.0000 ug | PREFILLED_SYRINGE | INTRAVENOUS | Status: DC | PRN
Start: 2016-04-16 — End: 2016-04-16

## 2016-04-16 MED ORDER — LACTATED RINGERS IV SOLN: 500.0000 mL | Freq: Once | INTRAVENOUS | Status: DC

## 2016-04-16 MED ORDER — FENTANYL 2.5 MCG/ML BUPIVACAINE 1/10 % EPIDURAL INFUSION (WH - ANES): 14.0000 mL/h | INTRAMUSCULAR | Status: DC | PRN

## 2016-04-16 MED ORDER — EPHEDRINE 5 MG/ML INJ
10.0000 mg | INTRAVENOUS | Status: DC | PRN
Start: 2016-04-16 — End: 2016-04-16

## 2016-04-16 MED ORDER — PRENATAL MULTIVITAMIN CH
1.0000 | ORAL_TABLET | Freq: Every day | ORAL | Status: DC
  Administered 2016-04-16 – 2016-04-18 (×3): 1 via ORAL
  Filled 2016-04-16 (×3): qty 1

## 2016-04-16 MED ORDER — DIPHENHYDRAMINE HCL 25 MG PO CAPS: 25.0000 mg | ORAL_CAPSULE | Freq: Four times a day (QID) | ORAL | Status: DC | PRN

## 2016-04-16 MED ORDER — PHENYLEPHRINE 40 MCG/ML (10ML) SYRINGE FOR IV PUSH (FOR BLOOD PRESSURE SUPPORT)
PREFILLED_SYRINGE | INTRAVENOUS | Status: DC
Start: 2016-04-16 — End: 2016-04-16
  Filled 2016-04-16: qty 20

## 2016-04-16 MED ORDER — SENNOSIDES-DOCUSATE SODIUM 8.6-50 MG PO TABS
2.0000 | ORAL_TABLET | ORAL | Status: DC
  Administered 2016-04-18: 2 via ORAL
  Filled 2016-04-16: qty 2

## 2016-04-16 NOTE — Lactation Note (Signed)
This note was copied from a baby's chart. Lactation Consultation Note  Patient Name: Krystal Wood ZOXWR'UToday's Date: 04/16/2016 Reason for consult: Initial assessment   Initial consult with mom of 9 hour old infant. Infant was asleep when LC entered room. Mom reports she feels BF is going well. She reports her older child would not latch and she pumped and bottle fed EBM for 7 months.   Enc mom to feed infant STS 8-12 x in 24 hours at first feeding cues. Enc mom to call out for feeding assistance as needed. BF Resources Handout and LC Brochure given, mom informed of IP/OP Services, BF Support Groups and LC phone #. Visit with mom cut short as Pediatrician in to speak with mom and assess infant. Mom without questions/concerns at this time.    Maternal Data Formula Feeding for Exclusion: Yes Reason for exclusion: Mother's choice to formula and breast feed on admission Does the patient have breastfeeding experience prior to this delivery?: Yes  Feeding    LATCH Score/Interventions                      Lactation Tools Discussed/Used WIC Program: Yes   Consult Status Consult Status: Follow-up Date: 04/17/16 Follow-up type: In-patient    Krystal Wood 04/16/2016, 12:47 PM

## 2016-04-16 NOTE — Progress Notes (Addendum)
LABOR PROGRESS NOTE  Subjective: Feeling more uncomfortable with contractions; getting ready for epidural placement.  Objective: BP 136/88   Pulse 83   Temp 97.9 F (36.6 C) (Oral)   Resp 18   Ht 5\' 1"  (1.549 m)   Wt 157 lb (71.2 kg)   LMP 07/16/2015   BMI 29.66 kg/m   Dilation: 6 Effacement (%): 90 Cervical Position: Posterior Station: -2 Presentation: Vertex Exam by:: Cletis MediaK. Anderson, RN  Assessment / Plan: 21 y.o. G2P1001 at 3368w2d here for IOL for gestational hypertension.  Labor:  Active labor, pit on 2.  SROM @ 0248. Fetal Wellbeing:  Category II - baseline 135, moderate variability, + accels, early and intermittent variable decels.  Reassured by variability & accels Pain Control:  Epidural planned Anticipated MOD:  vaginal  Charlsie MerlesJulia Rhoden, MD 04/16/2016, 2:57 AM

## 2016-04-17 LAB — RPR: RPR: NONREACTIVE
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
## 2016-04-17 NOTE — Lactation Note (Signed)
This note was copied from a baby's chart. Lactation Consultation Note  Patient Name: Krystal Joneen CarawayZainab Dolata WUJWJ'XToday's Date: 04/17/2016 Reason for consult: Follow-up assessment;Hyperbilirubinemia   Follow up with mom of 35 hour old infant. Infant was out of phototherapy lights and mom was finishing bottle feeding. Enc mom to keep infant under lights as much as possible.   Mom reports she is BF infant prior to offering formula. She reports that she is having nipple pain with initial latch that improves with feeding. Mom reports infant is eating well.  Mom is not pumping as she says she is not getting any milk from the pump. Discussed that pumping is used to stimulate milk production, Enc her to pump post BF to stimulate milk production.   Mom declined need for assistance, mom denied questions/concerns at this time. Enc mom to call out for feeding assistance as needed.    Maternal Data Formula Feeding for Exclusion: Yes Reason for exclusion: Mother's choice to formula and breast feed on admission  Feeding    LATCH Score/Interventions                      Lactation Tools Discussed/Used Pump Review: Setup, frequency, and cleaning   Consult Status Consult Status: Follow-up Date: 04/18/16 Follow-up type: In-patient    Silas FloodSharon S Tyshell Ramberg 04/17/2016, 3:11 PM

## 2016-04-17 NOTE — Progress Notes (Signed)
Post Partum Day #1 Subjective: no complaints, up ad lib, voiding, tolerating PO and + flatus  Objective: Blood pressure 110/77, pulse 86, temperature 97.7 F (36.5 C), temperature source Oral, resp. rate 18, height 5\' 1"  (1.549 m), weight 157 lb (71.2 kg), last menstrual period 07/16/2015, SpO2 100 %, unknown if currently breastfeeding.  Physical Exam:  General: alert, cooperative and no distress Lochia: appropriate Uterine Fundus: firm Incision: NA DVT Evaluation: No evidence of DVT seen on physical exam.   Recent Labs  04/15/16 2210  HGB 10.7*  HCT 32.1*    Assessment/Plan: Plan for discharge tomorrow   LOS: 2 days   Tawnya CrookHogan, Ulla Mckiernan Donovan 04/17/2016, 9:26 AM

## 2016-04-18 ENCOUNTER — Ambulatory Visit: Payer: Self-pay

## 2016-04-18 MED ORDER — IBUPROFEN 600 MG PO TABS: 600.0000 mg | ORAL_TABLET | Freq: Four times a day (QID) | ORAL | 0 refills | Status: AC

## 2016-04-18 NOTE — Lactation Note (Signed)
This note was copied from a baby's chart. Lactation Consultation Note P2 mom exclusively pumped for 7 months with first child.  Current infant on lights; mom had just fed for 20 minutes/infant in bassinet cueing.  LC suggest mom put infant to breast.  Mom attempted but infant had some difficulty with latching.  LC noted mom's breast very full and mom stated she was sore around her breast.  She is currently giving formula and breastfeeding.  Mom states she has not pumped today; pumped using DEBP yesterday but got nothing out.  LC encouraged mom to pump; assisted in starting pumping.  LC changed flange size to 21 on right side.  Informed mom and mom's RN about flange size and to re evaluate with each pumping session since mom's milk is coming in now.  Mom states the 21 on the right side feels comfortable and 24 on the left feels good.  LC uses breast massage  In order to increase milk flow while mom pumps.  Mom encouraged to pump after infant feeds in order to supplement infant with her own milk if too sleepy to eat at the breast.  LC answered questions; mom knows to call out for further help/assistance with feeds or pumping.  [facility].  [facility] that  Mom pumped out 40 ml of EBM.   Patient Name: [name] ZOXWR'UToday's Date: 04/18/2016 Reason for consult: Follow-up assessment   Maternal Data    Feeding Feeding Type: Breast Fed Length of feed: 3 min  LATCH Score/Interventions Latch: Grasps breast easily, tongue down, lips flanged, rhythmical sucking. Intervention(s): Assist with latch  Audible Swallowing: A few with stimulation  Type of Nipple: Everted at rest and after stimulation  Comfort (Breast/Nipple): (S) Filling, red/small blisters or bruises, mild/mod discomfort (filling; mild discomfort/milk coming in)  Problem noted: Filling;Mild/Moderate discomfort (had mom pumped) Interventions (Filling): Massage Interventions (Mild/moderate discomfort): Hand  expression;Pre-pump if needed  Hold (Positioning): Assistance needed to correctly position infant at breast and maintain latch. Intervention(s): Breastfeeding basics reviewed;Support Pillows;Position options;Skin to skin  LATCH Score: 7  Lactation Tools Discussed/Used     Consult Status Consult Status: Follow-up Date: 04/19/16 Follow-up type: In-patient    [name] 04/18/2016, 3:11 PM

## 2016-04-18 NOTE — Progress Notes (Signed)
Child Protective Services accepted report made this morning with a 72 hour response time.  Therefore, baby can discharge to MOB's care and CPS worker will follow up in the home.

## 2016-04-18 NOTE — Discharge Summary (Signed)
OB Discharge Summary  Patient Name: Krystal Wood DOB: [birth year] MRN: [id number]  Date of admission: 04/15/2016 Delivering MD: Genice RougeHODEN, JULIA L   Date of discharge: 04/18/2016  Admitting diagnosis: 39WKS IN LABOR Intrauterine pregnancy: 653w2d     Secondary diagnosis:Active Problems:   Normal labor  Additional problems:GHTN     Discharge diagnosis: Term Pregnancy Delivered and CHTN                                                                      Augmentation: Pitocin  Complications: None  Hospital course:  Onset of Labor With Vaginal Delivery     21 y.o. yo [id number] at 8753w2d was admitted in Latent Labor on 04/15/2016. Patient had an uncomplicated labor course as follows:  Membrane Rupture Time/Date: 2:48 AM ,04/16/2016   Intrapartum Procedures: Episiotomy: None [1]                                         Lacerations:  1st degree [2]  Patient had a delivery of a Viable infant. 04/16/2016  Information for the patient's newborn:  Krystal Wood, Boy Krystal Wood [[id number]][[id number]]  Delivery Method: Vaginal, Spontaneous Delivery (Filed from Delivery Summary)    Pateint had an uncomplicated postpartum course.  She is ambulating, tolerating a regular diet, passing flatus, and urinating well. Patient is discharged home in stable condition on 04/18/16.   Physical exam  Vitals:   04/16/16 1700 04/17/16 0715 04/17/16 1827 04/18/16 0541  BP: 117/72 110/77 111/69 116/72  Pulse: 81 86 96 81  Resp:  18 18 18   Temp: 98.7 F (37.1 C) 97.7 F (36.5 C) 98 F (36.7 C) 98.2 F (36.8 C)  TempSrc: Oral Oral Oral Oral  SpO2:      Weight:      Height:       General: alert Lochia: appropriate Uterine Fundus: firm and NT at U-2 Incision: N/A DVT Evaluation: No evidence of DVT seen on physical exam. Labs: Lab Results  Component Value Date   WBC 10.2 04/15/2016   HGB 10.7 (L) 04/15/2016   HCT 32.1 (L) 04/15/2016   MCV 88.9 04/15/2016   PLT 155 04/15/2016   CMP Latest Ref Rng & Units 04/15/2016   Glucose 65 - 99 mg/dL 81  BUN 6 - 20 mg/dL 9  Creatinine 6.960.44 - 2.951.00 mg/dL 2.84(X0.42(L)  Sodium 324135 - 401145 mmol/L 136  Potassium 3.5 - 5.1 mmol/L 3.6  Chloride 101 - 111 mmol/L 106  CO2 22 - 32 mmol/L 22  Calcium 8.9 - 10.3 mg/dL 0.2(V8.6(L)  Total Protein 6.5 - 8.1 g/dL 6.9  Total Bilirubin 0.3 - 1.2 mg/dL 0.4  Alkaline Phos 38 - 126 U/L 136(H)  AST 15 - 41 U/L 17  ALT 14 - 54 U/L 14    Discharge instruction: per After Visit Summary and "Baby and Me Booklet".  After Visit Meds:  Allergies as of 04/18/2016   No Known Allergies     Medication List    TAKE these medications   ibuprofen 600 MG tablet Commonly known as:  ADVIL,MOTRIN Take 1 tablet (600 mg total) by mouth every 6 (six) hours.  prenatal multivitamin Tabs tablet Take 1 tablet by mouth daily at 12 noon.       Diet: routine diet  Activity: Advance as tolerated. Pelvic rest for 6 weeks.   Outpatient follow up:6 weeks Follow up Appt:No future appointments. Follow up visit: No Follow-up on file.  Postpartum contraception: None  Newborn Data: Live born female  Birth Weight: 7 lb 5.8 oz (3340 g) APGAR: 9, 10  Baby Feeding: Breast Disposition:rooming in as baby is on the bililights   04/18/2016 Krystal Bossier, MD

## 2016-04-18 NOTE — Discharge Instructions (Signed)
cont Contraception Choices Contraception, also called birth control, means things to use or ways to try not to get pregnant. Hormonal birth control  This kind of birth control uses hormones. Here are some types of hormonal birth control:  A tube that is put under skin of the arm (implant). The tube can stay in for as long as 3 years.  Shots to get every 3 months (injections).  Pills to take every day (birth control pills).  A patch to change 1 time each week for 3 weeks (birth control patch). After that, the patch is taken off for 1 week.  A ring to put in the vagina. The ring is left in for 3 weeks. Then it is taken out of the vagina for 1 week. Then a new ring is put in.  Pills to take after unprotected sex (emergency birth control pills). Barrier birth control  Here are some types of barrier birth control:  A thin covering that is put on the penis before sex (female condom). The covering is thrown away after sex.  A soft, loose covering that is put in the vagina before sex (female condom). The covering is thrown away after sex.  A rubber bowl that sits over the cervix (diaphragm). The bowl must be made for you. The bowl is put into the vagina before sex. The bowl is left in for 6-8 hours after sex. It is taken out within 24 hours.  A small, soft cup that fits over the cervix (cervical cap). The cup must be made for you. The cup can be left in for 6-8 hours after sex. It is taken out within 48 hours.  A sponge that is put into the vagina before sex. It must be left in for at least 6 hours after sex. It must be taken out within 30 hours. Then it is thrown away.  A chemical that kills or stops sperm from getting into the uterus (spermicide). It may be a pill, cream, jelly, or foam to put in the vagina. The chemical should be used at least 10-15 minutes before sex. IUD (intrauterine) birth control An IUD is a small, T-shaped piece of plastic. It is put inside the uterus. There are two  kinds:  Hormone IUD. This kind can stay in for 3-5 years.  Copper IUD. This kind can stay in for 10 years. Permanent birth control Here are some types of permanent birth control:  Surgery to block the fallopian tubes.  Having an insert put into each fallopian tube.  Surgery to tie off the tubes that carry sperm (vasectomy). Natural planning birth control Here are some types of natural planning birth control:  Not having sex on the days the woman could get pregnant.  Using a calendar:  To keep track of the length of each period.  To find out what days pregnancy can happen.  To plan to not have sex on days when pregnancy can happen.  Watching for symptoms of ovulation and not having sex during ovulation. One way the woman can check for ovulation is to check her temperature.  Waiting to have sex until after ovulation. Summary  Contraception, also called birth control, means things to use or ways to try not to get pregnant.  Hormonal methods of birth control include implants, injections, pills, patches, vaginal rings, and emergency birth control pills.  Barrier methods of birth control can include female condoms, female condoms, diaphragms, cervical caps, sponges, and spermicides.  There are two types of IUD (intrauterine  device) birth control. An IUD can be put in a woman's uterus to prevent pregnancy for 3-5 years.  Permanent sterilization can be done through a procedure for males, females, or both.  Natural planning methods involve not having sex on the days when the woman could get pregnant. This information is not intended to replace advice given to you by your health care provider. Make sure you discuss any questions you have with your health care provider. Document Released: 11/11/2008 Document Revised: 01/25/2016 Document Reviewed: 01/25/2016 Elsevier Interactive Patient Education  2017 Elsevier Inc. Home Care Instructions for Mom  ACTIVITY  Gradually return to your  regular activities.  Let yourself rest. Nap while your baby sleeps.  Avoid lifting anything that is heavier than 10 lb (4.5 kg) until your health care provider says it is okay.  Avoid activities that take a lot of effort and energy (are strenuous) until approved by your health care provider. Walking at a slow-to-moderate pace is usually safe.  If you had a cesarean delivery:  Do not vacuum, climb stairs, or drive a car for 4-6 weeks.  Have someone help you at home until you feel like you can do your usual activities yourself.  Do exercises as told by your health care provider, if this applies. VAGINAL BLEEDING You may continue to bleed for 4-6 weeks after delivery. Over time, the amount of blood usually decreases and the color of the blood usually gets lighter. However, the flow of bright red blood may increase if you have been too active. If you need to use more than one pad in an hour because your pad gets soaked, or if you pass a large clot:  Lie down.  Raise your feet.  Place a cold compress on your lower abdomen.  Rest.  Call your health care provider. If you are breastfeeding, your period should return anytime between 8 weeks after delivery and the time that you stop breastfeeding. If you are not breastfeeding, your period should return 6-8 weeks after delivery. PERINEAL CARE The perineal area, or perineum, is the part of your body between your thighs. After delivery, this area needs special care. Follow these instructions as told by your health care provider.  Take warm tub baths for 15-20 minutes.  Use medicated pads and pain-relieving sprays and creams as told.  Do not use tampons or douches until vaginal bleeding has stopped.  Each time you go to the bathroom:  Use a peri bottle.  Change your pad.  Use towelettes in place of toilet paper until your stitches have healed.  Do Kegel exercises every day. Kegel exercises help to maintain the muscles that support the  vagina, bladder, and bowels. You can do these exercises while you are standing, sitting, or lying down. To do Kegel exercises:  Tighten the muscles of your abdomen and the muscles that surround your birth canal.  Hold for a few seconds.  Relax.  Repeat until you have done this 5 times in a row.  To prevent hemorrhoids from developing or getting worse:  Drink enough fluid to keep your urine clear or pale yellow.  Avoid straining when having a bowel movement.  Take over-the-counter medicines and stool softeners as told by your health care provider. BREAST CARE  Wear a tight-fitting bra.  Avoid taking over-the-counter pain medicine for breast discomfort.  Apply ice to the breasts to help with discomfort as needed:  Put ice in a plastic bag.  Place a towel between your skin and the bag.  Leave the ice on for 20 minutes or as told by your health care provider. NUTRITION  Eat a well-balanced diet.  Do not try to lose weight quickly by cutting back on calories.  Take your prenatal vitamins until your postpartum checkup or until your health care provider tells you to stop. POSTPARTUM DEPRESSION You may find yourself crying for no apparent reason and unable to cope with all of the changes that come with having a newborn. This mood is called postpartum depression. Postpartum depression happens because your hormone levels change after delivery. If you have postpartum depression, get support from your partner, friends, and family. If the depression does not go away on its own after several weeks, contact your health care provider. BREAST SELF-EXAM Do a breast self-exam each month, at the same time of the month. If you are breastfeeding, check your breasts just after a feeding, when your breasts are less full. If you are breastfeeding and your period has started, check your breasts on day 5, 6, or 7 of your period. Report any lumps, bumps, or discharge to your health care provider. Know  that breasts are normally lumpy if you are breastfeeding. This is temporary, and it is not a health risk. INTIMACY AND SEXUALITY Avoid sexual activity for at least 3-4 weeks after delivery or until the brownish-red vaginal flow is completely gone. If you want to avoid pregnancy, use some form of birth control. You can get pregnant after delivery, even if you have not had your period. SEEK MEDICAL CARE IF:  You feel unable to cope with the changes that a child brings to your life, and these feelings do not go away after several weeks.  You notice a lump, a bump, or discharge on your breast. SEEK IMMEDIATE MEDICAL CARE IF:  Blood soaks your pad in 1 hour or less.  You have:  Severe pain or cramping in your lower abdomen.  A bad-smelling vaginal discharge.  A fever that is not controlled by medicine.  A fever, and an area of your breast is red and sore.  Pain or redness in your calf.  Sudden, severe chest pain.  Shortness of breath.  Painful or bloody urination.  Problems with your vision.  You vomit for 12 hours or longer.  You develop a severe headache.  You have serious thoughts about hurting yourself, your child, or anyone else. This information is not intended to replace advice given to you by your health care provider. Make sure you discuss any questions you have with your health care provider. Document Released: 01/12/2000 Document Revised: 06/22/2015 Document Reviewed: 07/18/2014 Elsevier Interactive Patient Education  2017 [employer].

## 2016-04-18 NOTE — Plan of Care (Signed)
Problem: Pain Management: Goal: General experience of comfort will improve and pain level will decrease Outcome: Progressing Pt denies pain most of the time, still taking scheduled Motrin
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
## 2016-04-19 ENCOUNTER — Ambulatory Visit: Payer: Self-pay

## 2016-04-19 NOTE — Lactation Note (Signed)
This note was copied from a baby's chart. Lactation Consultation Note  Mother reports that infant is sleepy at the breast but that she is supplementing after BF. Her milk has come to volume and she expressed 3 oz today. She reports pumping 3-4 times in 24 hours.  Explained to her that if the baby did not drain the breast at feedings she needed to do so to protect her supply. Attempted to feed the baby but baby fell asleep when brought to the breast and would not latch. Follow-up as needed.  Patient Name: Krystal Wood CarawayZainab Hebner ZOXWR'UToday's Date: 04/19/2016 Reason for consult: Follow-up assessment   Maternal Data    Feeding Feeding Type: Breast Fed Nipple Type: Slow - flow Length of feed: 0 min  LATCH Score/Interventions Latch: Too sleepy or reluctant, no latch achieved, no sucking elicited.  Audible Swallowing: None  Type of Nipple: Everted at rest and after stimulation  Comfort (Breast/Nipple): Soft / non-tender     Hold (Positioning): No assistance needed to correctly position infant at breast.  LATCH Score: 6  Lactation Tools Discussed/Used     Consult Status Consult Status: Complete    Soyla DryerJoseph, Nikia Levels 04/19/2016, 10:41 AM
